# Patient Record
Sex: Male | Born: 1985 | Race: White | Hispanic: No | Marital: Married | Smoking: Current every day smoker
Health system: Southern US, Community
[De-identification: ages and names within clinical notes are randomized; demographics above are authoritative.]

## PROBLEM LIST (undated history)

## (undated) DIAGNOSIS — K625 Hemorrhage of anus and rectum: Secondary | ICD-10-CM

## (undated) DIAGNOSIS — F329 Major depressive disorder, single episode, unspecified: Secondary | ICD-10-CM

## (undated) DIAGNOSIS — F1020 Alcohol dependence, uncomplicated: Secondary | ICD-10-CM

## (undated) DIAGNOSIS — K645 Perianal venous thrombosis: Secondary | ICD-10-CM

## (undated) DIAGNOSIS — K602 Anal fissure, unspecified: Secondary | ICD-10-CM

## (undated) DIAGNOSIS — F909 Attention-deficit hyperactivity disorder, unspecified type: Secondary | ICD-10-CM

## (undated) DIAGNOSIS — F419 Anxiety disorder, unspecified: Secondary | ICD-10-CM

## (undated) DIAGNOSIS — F32A Depression, unspecified: Secondary | ICD-10-CM

## (undated) DIAGNOSIS — F1910 Other psychoactive substance abuse, uncomplicated: Secondary | ICD-10-CM

## (undated) DIAGNOSIS — K219 Gastro-esophageal reflux disease without esophagitis: Secondary | ICD-10-CM

## (undated) HISTORY — DX: Anxiety disorder, unspecified: F41.9

## (undated) HISTORY — DX: Hemorrhage of anus and rectum: K62.5

## (undated) HISTORY — DX: Alcohol dependence, uncomplicated: F10.20

## (undated) HISTORY — PX: HEMORRHOID SURGERY: SHX153

## (undated) HISTORY — DX: Perianal venous thrombosis: K64.5

## (undated) HISTORY — DX: Depression, unspecified: F32.A

## (undated) HISTORY — PX: WISDOM TOOTH EXTRACTION: SHX21

## (undated) HISTORY — DX: Gastro-esophageal reflux disease without esophagitis: K21.9

## (undated) HISTORY — DX: Other psychoactive substance abuse, uncomplicated: F19.10

## (undated) HISTORY — DX: Attention-deficit hyperactivity disorder, unspecified type: F90.9

## (undated) HISTORY — DX: Major depressive disorder, single episode, unspecified: F32.9

## (undated) HISTORY — PX: OTHER SURGICAL HISTORY: SHX169

---

## 2006-05-20 ENCOUNTER — Ambulatory Visit (HOSPITAL_COMMUNITY): Admission: RE | Admit: 2006-05-20 | Discharge: 2006-05-20 | Payer: Self-pay | Admitting: Orthopedic Surgery

## 2007-12-17 ENCOUNTER — Emergency Department (HOSPITAL_COMMUNITY): Admission: EM | Admit: 2007-12-17 | Discharge: 2007-12-17 | Payer: Self-pay | Admitting: Emergency Medicine

## 2008-10-09 ENCOUNTER — Ambulatory Visit (HOSPITAL_COMMUNITY): Admission: RE | Admit: 2008-10-09 | Discharge: 2008-10-09 | Payer: Self-pay | Admitting: Family Medicine

## 2009-01-28 ENCOUNTER — Emergency Department (HOSPITAL_COMMUNITY): Admission: EM | Admit: 2009-01-28 | Discharge: 2009-01-28 | Payer: Self-pay | Admitting: Emergency Medicine

## 2009-05-04 ENCOUNTER — Emergency Department (HOSPITAL_COMMUNITY): Admission: EM | Admit: 2009-05-04 | Discharge: 2009-05-04 | Payer: Self-pay | Admitting: Emergency Medicine

## 2009-10-08 ENCOUNTER — Emergency Department (HOSPITAL_COMMUNITY): Admission: EM | Admit: 2009-10-08 | Discharge: 2009-10-08 | Payer: Self-pay | Admitting: Emergency Medicine

## 2010-03-08 ENCOUNTER — Emergency Department (HOSPITAL_COMMUNITY)
Admission: EM | Admit: 2010-03-08 | Discharge: 2010-03-09 | Payer: Self-pay | Source: Home / Self Care | Admitting: Emergency Medicine

## 2010-03-11 LAB — BASIC METABOLIC PANEL
BUN: 2 mg/dL — ABNORMAL LOW (ref 6–23)
CO2: 26 mEq/L (ref 19–32)
Calcium: 9.3 mg/dL (ref 8.4–10.5)
Chloride: 108 mEq/L (ref 96–112)
Creatinine, Ser: 0.8 mg/dL (ref 0.4–1.5)
GFR calc Af Amer: >60 mL/min (ref >60)
GFR calc non Af Amer: >60 mL/min (ref >60)
Glucose, Bld: 100 mg/dL — ABNORMAL HIGH (ref 70–99)
Potassium: 3.5 mEq/L (ref 3.5–5.1)
Sodium: 144 mEq/L (ref 135–145)

## 2010-03-11 LAB — DIFFERENTIAL
Basophils Absolute: 0 10*3/uL (ref 0.0–0.1)
Basophils Relative: 0 % (ref 0–1)
Eosinophils Absolute: 0.1 10*3/uL (ref 0.0–0.7)
Eosinophils Relative: 1 % (ref 0–5)
Lymphocytes Relative: 42 % (ref 12–46)
Lymphs Abs: 3 10*3/uL (ref 0.7–4.0)
Monocytes Absolute: 0.5 10*3/uL (ref 0.1–1.0)
Monocytes Relative: 7 % (ref 3–12)
Neutro Abs: 3.5 10*3/uL (ref 1.7–7.7)
Neutrophils Relative %: 50 % (ref 43–77)

## 2010-03-11 LAB — CBC
HCT: 44.2 % (ref 39.0–52.0)
Hemoglobin: 15.8 g/dL (ref 13.0–17.0)
MCH: 32.9 pg (ref 26.0–34.0)
MCHC: 35.7 g/dL (ref 30.0–36.0)
MCV: 92.1 fL (ref 78.0–100.0)
Platelets: 161 10*3/uL (ref 150–400)
RBC: 4.8 MIL/uL (ref 4.22–5.81)
RDW: 13.1 % (ref 11.5–15.5)
WBC: 7.1 10*3/uL (ref 4.0–10.5)

## 2010-03-11 LAB — ETHANOL: Alcohol, Ethyl (B): 191 mg/dL — ABNORMAL HIGH (ref 0–10)

## 2010-05-20 LAB — URINALYSIS, ROUTINE W REFLEX MICROSCOPIC
Bilirubin Urine: NEGATIVE
Glucose, UA: NEGATIVE mg/dL
Ketones, ur: NEGATIVE mg/dL
Leukocytes, UA: NEGATIVE
Nitrite: NEGATIVE
Protein, ur: NEGATIVE mg/dL
Specific Gravity, Urine: 1.01 (ref 1.005–1.030)
Urobilinogen, UA: 0.2 mg/dL (ref 0.0–1.0)
pH: 5.5 (ref 5.0–8.0)

## 2010-05-20 LAB — CBC
HCT: 56.3 % — ABNORMAL HIGH (ref 39.0–52.0)
Hemoglobin: 19.8 g/dL — ABNORMAL HIGH (ref 13.0–17.0)
MCHC: 35.1 g/dL (ref 30.0–36.0)
MCV: 91.8 fL (ref 78.0–100.0)
Platelets: 142 10*3/uL — ABNORMAL LOW (ref 150–400)
RBC: 6.14 MIL/uL — ABNORMAL HIGH (ref 4.22–5.81)
RDW: 13.6 % (ref 11.5–15.5)
WBC: 7.3 10*3/uL (ref 4.0–10.5)

## 2010-05-20 LAB — BASIC METABOLIC PANEL
BUN: 5 mg/dL — ABNORMAL LOW (ref 6–23)
CO2: 26 mEq/L (ref 19–32)
Calcium: 9.6 mg/dL (ref 8.4–10.5)
Chloride: 109 mEq/L (ref 96–112)
Creatinine, Ser: 0.87 mg/dL (ref 0.4–1.5)
GFR calc Af Amer: >60 mL/min (ref >60)
GFR calc non Af Amer: >60 mL/min (ref >60)
Glucose, Bld: 123 mg/dL — ABNORMAL HIGH (ref 70–99)
Potassium: 3.4 mEq/L — ABNORMAL LOW (ref 3.5–5.1)
Sodium: 143 mEq/L (ref 135–145)

## 2010-05-20 LAB — DIFFERENTIAL
Basophils Absolute: 0 K/uL (ref 0.0–0.1)
Basophils Relative: 0 % (ref 0–1)
Eosinophils Absolute: 0 K/uL (ref 0.0–0.7)
Eosinophils Relative: 0 % (ref 0–5)
Lymphocytes Relative: 21 % (ref 12–46)
Lymphs Abs: 1.5 K/uL (ref 0.7–4.0)
Monocytes Absolute: 0.6 K/uL (ref 0.1–1.0)
Monocytes Relative: 8 % (ref 3–12)
Neutro Abs: 5.2 K/uL (ref 1.7–7.7)
Neutrophils Relative %: 71 % (ref 43–77)

## 2010-05-20 LAB — URINE MICROSCOPIC-ADD ON

## 2010-05-28 LAB — URINALYSIS, ROUTINE W REFLEX MICROSCOPIC
Bilirubin Urine: NEGATIVE
Glucose, UA: NEGATIVE mg/dL
Ketones, ur: NEGATIVE mg/dL
Leukocytes, UA: NEGATIVE
Nitrite: NEGATIVE
Protein, ur: NEGATIVE mg/dL
Specific Gravity, Urine: 1.011 (ref 1.005–1.030)
Urobilinogen, UA: 0.2 mg/dL (ref 0.0–1.0)
pH: 6 (ref 5.0–8.0)

## 2010-05-28 LAB — COMPREHENSIVE METABOLIC PANEL
ALT: 24 U/L (ref 0–53)
AST: 25 U/L (ref 0–37)
Albumin: 4.2 g/dL (ref 3.5–5.2)
Alkaline Phosphatase: 86 U/L (ref 39–117)
BUN: 10 mg/dL (ref 6–23)
CO2: 23 mEq/L (ref 19–32)
Calcium: 8.9 mg/dL (ref 8.4–10.5)
Chloride: 107 mEq/L (ref 96–112)
Creatinine, Ser: 0.81 mg/dL (ref 0.4–1.5)
GFR calc Af Amer: >60 mL/min (ref >60)
GFR calc non Af Amer: >60 mL/min (ref >60)
Glucose, Bld: 80 mg/dL (ref 70–99)
Potassium: 3.7 mEq/L (ref 3.5–5.1)
Sodium: 142 mEq/L (ref 135–145)
Total Bilirubin: 0.7 mg/dL (ref 0.3–1.2)
Total Protein: 6.7 g/dL (ref 6.0–8.3)

## 2010-05-28 LAB — DIFFERENTIAL
Basophils Absolute: 0.1 10*3/uL (ref 0.0–0.1)
Basophils Relative: 1 % (ref 0–1)
Eosinophils Absolute: 0.6 10*3/uL (ref 0.0–0.7)
Eosinophils Relative: 8 % — ABNORMAL HIGH (ref 0–5)
Lymphocytes Relative: 38 % (ref 12–46)
Lymphs Abs: 3.1 10*3/uL (ref 0.7–4.0)
Monocytes Absolute: 0.5 10*3/uL (ref 0.1–1.0)
Monocytes Relative: 7 % (ref 3–12)
Neutro Abs: 3.8 10*3/uL (ref 1.7–7.7)
Neutrophils Relative %: 47 % (ref 43–77)

## 2010-05-28 LAB — URINE MICROSCOPIC-ADD ON

## 2010-05-28 LAB — CBC
HCT: 51.4 % (ref 39.0–52.0)
Hemoglobin: 18.1 g/dL — ABNORMAL HIGH (ref 13.0–17.0)
MCHC: 35.2 g/dL (ref 30.0–36.0)
MCV: 96.2 fL (ref 78.0–100.0)
Platelets: 196 10*3/uL (ref 150–400)
RBC: 5.34 MIL/uL (ref 4.22–5.81)
RDW: 13.2 % (ref 11.5–15.5)
WBC: 8.2 10*3/uL (ref 4.0–10.5)

## 2010-05-28 LAB — RAPID URINE DRUG SCREEN, HOSP PERFORMED
Amphetamines: NOT DETECTED
Barbiturates: NOT DETECTED
Benzodiazepines: POSITIVE — AB
Cocaine: NOT DETECTED
Opiates: NOT DETECTED
Tetrahydrocannabinol: NOT DETECTED

## 2010-05-28 LAB — ETHANOL: Alcohol, Ethyl (B): 149 mg/dL — ABNORMAL HIGH (ref 0–10)

## 2010-06-17 ENCOUNTER — Other Ambulatory Visit (HOSPITAL_COMMUNITY): Payer: Self-pay | Admitting: Pulmonary Disease

## 2010-06-19 ENCOUNTER — Ambulatory Visit (HOSPITAL_COMMUNITY)
Admission: RE | Admit: 2010-06-19 | Discharge: 2010-06-19 | Disposition: A | Discharge status: Home / Self Care | Payer: 59 | Source: Ambulatory Visit | Attending: Pulmonary Disease | Admitting: Pulmonary Disease

## 2010-06-20 ENCOUNTER — Ambulatory Visit (HOSPITAL_COMMUNITY): Payer: 59

## 2010-06-20 ENCOUNTER — Ambulatory Visit (INDEPENDENT_AMBULATORY_CARE_PROVIDER_SITE_OTHER): Payer: 59 | Admitting: Gastroenterology

## 2010-06-20 ENCOUNTER — Encounter: Payer: Self-pay | Admitting: Gastroenterology

## 2010-06-20 DIAGNOSIS — K645 Perianal venous thrombosis: Secondary | ICD-10-CM

## 2010-06-20 DIAGNOSIS — K921 Melena: Secondary | ICD-10-CM

## 2010-06-20 DIAGNOSIS — R109 Unspecified abdominal pain: Secondary | ICD-10-CM

## 2010-06-20 MED ORDER — HYDROCORTISONE 2.5 % RE CREA: TOPICAL_CREAM | RECTAL | Status: DC

## 2010-06-20 MED ORDER — HYDROCORTISONE 2.5 % RE CREA: TOPICAL_CREAM | RECTAL | Status: AC

## 2010-06-20 NOTE — Progress Notes (Signed)
Referring Provider: Dr. Juanetta Gosling Primary Care Physician:  Fredirick Maudlin, MD Primary Gastroenterologist:  Dr. Jena Gauss  Chief Complaint  Patient presents with  . Rectal Bleeding    on toilet paper and in stool per pt    HPI:   Anthony Morales is a 25 year old Caucasian male who presents as a referral from Dr. Juanetta Gosling. He reports lower abdominal pain X 1 month, intermittent, described as "clenching". He states at times this extends down to his knees. Feels it is always underlying, waxes and wanes. Not worsened by eating/drinking. Nothing relieves. Not associated with BM. Reports BM every day, +blood in stools X 1 year: paper hematochezia and moderate amount in stool. Was previously taking a few BC powders per day due to headaches in the past but has stopped X 1 month. No NSAIDs. Reports weighing 120s a few months ago. Now 105. No lack of appetite. Complains of headaches.  He also reports rectal discomfort, itching. Feeling of fullness in the perineal area. Hx of hemorrhoids.  CT April 2012 at [facility]: retrocecal appendix, normal. No bowel dilatation, free fluid, free air, or adenopathy.  LFTs nl Hgb 15 WBC 5.8  Past Medical History  Diagnosis Date  . Anxiety   . ADD (attention deficit disorder with hyperactivity)     Past Surgical History  Procedure Date  . Wisdom tooth extraction     Current Outpatient Prescriptions  Medication Sig Dispense Refill  . ALPRAZolam (XANAX) 0.5 MG tablet Take 1 tablet by mouth Twice daily.      Marland Kitchen amphetamine-dextroamphetamine (ADDERALL) 30 MG tablet Take 1 tablet by mouth Twice daily.      Marland Kitchen esomeprazole (NEXIUM) 40 MG capsule Take 40 mg by mouth daily before breakfast.        . loratadine (CLARITIN) 10 MG tablet Take 10 mg by mouth daily.        Marland Kitchen UNKNOWN TO PATIENT Take 1 tablet by mouth 2 (two) times daily.          Allergies as of 06/20/2010  . (No Known Allergies)    Family History  Problem Relation Age of Onset  . Hypertension Father    living  . Colon cancer Neg Hx     History   Social History  . Marital Status: Single   Social History Main Topics  . Smoking status: Current Everyday Smoker -- 0.5 packs/day for 10 years    Types: Cigarettes  . Smokeless tobacco: Never Used  . Alcohol Use: No     quit drinking beginning of this year Jan 15th. used to drink every day.   . Drug Use: 14 per week    Special: Marijuana     several times per day  . Sexually Active: Not Currently -- Male partner(s)    Birth Control/ Protection: Pill     spouse    Review of Systems: Gen: Denies any fever, chills, sweats. Complains of wt loss.  CV: Denies chest pain, angina, palpitations, syncope, orthopnea, PND, peripheral edema, and claudication. Resp: Denies dyspnea at rest, dyspnea with exercise, cough, sputum, wheezing, coughing up blood, and pleurisy. GI: Denies vomiting blood, jaundice, and fecal incontinence.   Denies dysphagia or odynophagia. GU : Denies urinary burning, blood in urine, urinary frequency, urinary hesitancy, nocturnal urination, and urinary incontinence. MS: Denies joint pain, limitation of movement, and swelling, stiffness, low back pain, extremity pain. Denies muscle weakness, cramps, atrophy.  Derm: Denies rash, itching, dry skin, hives, moles, warts, or unhealing ulcers.  Psych: Denies depression, anxiety,  memory loss, suicidal ideation, hallucinations, paranoia, and confusion. Heme: Denies bruising, bleeding, and enlarged lymph nodes.  Physical Exam: BP 118/81  Pulse 90  Temp 98.6 F (37 C)  Ht 5\' 10"  (1.778 m)  Wt 105 lb 9.6 oz (47.9 kg)  BMI 15.15 kg/m2 General:   Flat affect, somewhat difficult to concentrate. Thin.  Head:  Normocephalic and atraumatic. Eyes:  Sclera clear, no icterus.   Conjunctiva pink. Ears:  Normal auditory acuity. Nose:  No deformity, discharge,  or lesions. Mouth:  No deformity or lesions, dentition normal. Neck:  Supple; no masses or thyromegaly. Lungs:  Clear  throughout to auscultation.   No wheezes, crackles, or rhonchi. No acute distress. Heart:  Regular rate and rhythm; no murmurs, clicks, rubs,  or gallops. Abdomen:  +BS, soft, thin, tender to palpation lower abdomen, no rebound or guarding. No HSM or masses noted.  Rectal:  Large external hemorrhoid, appears thrombosed, tender to palpation, no evidence of bleeding.  Msk:  Symmetrical without gross deformities. Normal posture. Extremities:  Without clubbing or edema. Neurologic:  Alert and  oriented x4;  grossly normal neurologically. Skin:  Intact without significant lesions or rashes. Cervical Nodes:  No significant cervical adenopathy. Psych:  Alert and cooperative. Normal mood and affect.

## 2010-06-20 NOTE — Patient Instructions (Signed)
We will refer you to see the general surgeon for possible management of your hemorrhoids.  In the interim, you will be set up for a colonoscopy. You will have a drug screen done at the time of your pre-op visit. They may decide to cancel or postpone your case if it is positive.  Avoid constipation. Continue stool softener daily.

## 2010-06-24 ENCOUNTER — Ambulatory Visit (HOSPITAL_COMMUNITY): Payer: 59

## 2010-06-24 ENCOUNTER — Encounter: Payer: Self-pay | Admitting: Gastroenterology

## 2010-06-24 DIAGNOSIS — K645 Perianal venous thrombosis: Secondary | ICD-10-CM | POA: Insufficient documentation

## 2010-06-24 DIAGNOSIS — R109 Unspecified abdominal pain: Secondary | ICD-10-CM | POA: Insufficient documentation

## 2010-06-24 DIAGNOSIS — K921 Melena: Secondary | ICD-10-CM | POA: Insufficient documentation

## 2010-06-24 NOTE — Assessment & Plan Note (Signed)
Paper hematochezia and in stool X 1 year. Intermittent. +lower abdominal pain. May be due to known hemorrhoids, but unable to exclude other etiology at this point. Will proceed with colonoscopy in next few weeks. Pt smokes marijuana daily, and he has been heavily counseled on cessation. He was informed a drug screen will be performed, and he may be cancelled if it is positive. He states "I will go to another state if I have to". Pt is fully aware of our drug-free policy.   Proceed with TCS with Dr. Jena Gauss in near future: the risks, benefits, and alternatives have been discussed with the patient in detail. The patient states understanding and desires to proceed. This will be done under deep sedation with propofol secondary to the daily use of marijuana.  Drug screen at pre-op

## 2010-06-24 NOTE — Progress Notes (Signed)
Cc to PCP 

## 2010-06-24 NOTE — Assessment & Plan Note (Signed)
Large external thrombosed hemorrhoid without evidence of active bleeding. May very well be contributing to hematochezia, but needs further evaluation due to abdominal pain and rectal bleeding (see abdominal pain assessment). In interim, will refer to surgery for elective management and relief of symptoms; anusol cream in interim.   Referral to surgery tomorrow Anusol cream Colonoscopy in next few weeks

## 2010-06-24 NOTE — Assessment & Plan Note (Signed)
lower abdominal pain X 1 month, intermittent, described as "clenching", sometimes extending down to knees. Feels underlying but waxes and wanes without pattern. Not associated or relieved by anything. BM daily. +hematochezia. No BC powders since pain started. Avoiding NSAIDs now. + wt loss from 120s reportedly to now 105. Pt is visibly thin. Denies lack of appetite. CT without etiology, no leukocytosis or anemia on labs. Hematochezia may be due to benign anorectal source (known hemorrhoids), unable to exclude other differentials to include IBS, IBD. Low-likelihood of malignancy but wt loss concerning.  See plan under "hematochezia".

## 2010-06-26 ENCOUNTER — Other Ambulatory Visit: Payer: Self-pay | Admitting: Internal Medicine

## 2010-06-26 DIAGNOSIS — K921 Melena: Secondary | ICD-10-CM

## 2010-07-01 ENCOUNTER — Encounter: Payer: Self-pay | Admitting: Internal Medicine

## 2010-07-03 ENCOUNTER — Encounter: Payer: Self-pay | Admitting: Gastroenterology

## 2010-07-11 ENCOUNTER — Other Ambulatory Visit (HOSPITAL_COMMUNITY): Payer: 59

## 2010-07-11 ENCOUNTER — Encounter: Payer: Self-pay | Admitting: General Practice

## 2010-07-11 NOTE — Progress Notes (Signed)
I received a call from [employer] in Endoscopy.  She stated the patient didn't show up for his pre-perative visit.  They were able to reach the patient by phone and he stated "he didn't want to have the procedure" on  next week because"he felt better."  I called the patient to verify this and left him a message on his cell phone.Marland KitchenMarland Kitchen

## 2010-07-12 NOTE — Progress Notes (Signed)
I tried to reach the pt again, no answer.  I called and left a message for Selena Batten in endoscopy to cancel his procedure.Marland KitchenMarland Kitchen

## 2010-07-18 ENCOUNTER — Ambulatory Visit (HOSPITAL_COMMUNITY): Admission: RE | Admit: 2010-07-18 | Payer: 59 | Source: Ambulatory Visit | Admitting: Internal Medicine

## 2010-07-18 ENCOUNTER — Encounter: Payer: 59 | Admitting: Internal Medicine

## 2010-07-19 ENCOUNTER — Other Ambulatory Visit (HOSPITAL_COMMUNITY): Payer: 59

## 2010-07-25 ENCOUNTER — Encounter: Payer: 59 | Admitting: Internal Medicine

## 2010-10-10 ENCOUNTER — Telehealth: Payer: Self-pay | Admitting: Internal Medicine

## 2010-10-10 NOTE — Telephone Encounter (Signed)
Patient came by to sign a release to have medical records transferred to [facility] and I explained to him that he cant be running back an fourth between the two offices and he said he was transferring his care to [facility] so I faxed his records over.

## 2010-10-11 ENCOUNTER — Encounter: Payer: Self-pay | Admitting: Internal Medicine

## 2010-10-11 NOTE — Progress Notes (Signed)
  Made change in epic

## 2010-10-11 NOTE — Progress Notes (Signed)
Do you want to send d/c letter?

## 2010-10-11 NOTE — Progress Notes (Signed)
  Patient has chosen to severe our doctor-patient relationship and is seeking  his medical care elsewhere. It is also notable he has been noncompliant here.

## 2010-10-11 NOTE — Progress Notes (Signed)
Noted  

## 2010-10-11 NOTE — Progress Notes (Signed)
  Per Dr Jena Gauss pt will not be seen here after 30 days from today.

## 2010-10-14 ENCOUNTER — Encounter: Payer: Self-pay | Admitting: Internal Medicine

## 2010-11-11 ENCOUNTER — Encounter: Payer: Self-pay | Admitting: Internal Medicine

## 2010-11-11 ENCOUNTER — Other Ambulatory Visit (INDEPENDENT_AMBULATORY_CARE_PROVIDER_SITE_OTHER): Payer: 59

## 2010-11-11 ENCOUNTER — Ambulatory Visit (INDEPENDENT_AMBULATORY_CARE_PROVIDER_SITE_OTHER): Payer: 59 | Admitting: Internal Medicine

## 2010-11-11 VITALS — BP 134/80 | HR 79 | Ht 69.0 in | Wt 112.0 lb

## 2010-11-11 DIAGNOSIS — R1030 Lower abdominal pain, unspecified: Secondary | ICD-10-CM

## 2010-11-11 DIAGNOSIS — K625 Hemorrhage of anus and rectum: Secondary | ICD-10-CM

## 2010-11-11 DIAGNOSIS — F1011 Alcohol abuse, in remission: Secondary | ICD-10-CM | POA: Insufficient documentation

## 2010-11-11 DIAGNOSIS — F909 Attention-deficit hyperactivity disorder, unspecified type: Secondary | ICD-10-CM | POA: Insufficient documentation

## 2010-11-11 DIAGNOSIS — R109 Unspecified abdominal pain: Secondary | ICD-10-CM

## 2010-11-11 DIAGNOSIS — F419 Anxiety disorder, unspecified: Secondary | ICD-10-CM | POA: Insufficient documentation

## 2010-11-11 DIAGNOSIS — F1210 Cannabis abuse, uncomplicated: Secondary | ICD-10-CM | POA: Insufficient documentation

## 2010-11-11 LAB — CBC WITH DIFFERENTIAL/PLATELET
Basophils Absolute: 0 10*3/uL (ref 0.0–0.1)
Eosinophils Absolute: 0.2 10*3/uL (ref 0.0–0.7)
HCT: 45.8 % (ref 39.0–52.0)
Hemoglobin: 15.5 g/dL (ref 13.0–17.0)
Lymphs Abs: 2.9 10*3/uL (ref 0.7–4.0)
MCHC: 33.9 g/dL (ref 30.0–36.0)
Neutro Abs: 4.3 10*3/uL (ref 1.4–7.7)
RDW: 14.1 % (ref 11.5–14.6)

## 2010-11-11 LAB — COMPREHENSIVE METABOLIC PANEL
ALT: 31 U/L (ref 0–53)
AST: 24 U/L (ref 0–37)
Alkaline Phosphatase: 49 U/L (ref 39–117)
Creatinine, Ser: 0.8 mg/dL (ref 0.4–1.5)
Total Bilirubin: 0.5 mg/dL (ref 0.3–1.2)

## 2010-11-11 LAB — PROTIME-INR: INR: 0.9 ratio (ref 0.8–1.0)

## 2010-11-11 MED ORDER — PEG-KCL-NACL-NASULF-NA ASC-C 100 G PO SOLR: 1.0000 | ORAL | Status: DC

## 2010-11-11 NOTE — Patient Instructions (Signed)
You have been scheduled for colonoscopy. Your prep has been sent to your pharmacy. Decrease your nexium to 20 mg per day. Please head down to the basement for labs today.

## 2010-11-11 NOTE — Progress Notes (Signed)
Subjective:    Patient ID: Anthony Morales, male    DOB: December 11, 1985, 25 y.o.   MRN: [id number]  HPI Anthony Morales is a 25 year old male with a past medical history of anxiety and ADD who presents for evaluation of intermittent rectal bleeding with diarrhea and abdominal pain.  The patient reports he developed bright red blood per rectum in March and April 2012. This was associated with an external hemorrhoid and he underwent a thrombectomy in April 2012. Since this time his hemorrhoidal pain has been improved but he is still seen blood in his stool. He reports continuing to see blood intermittently despite hemorrhoids not being a big problem for him at present. He denies melena. The last time he saw a red blood in his stool was 3 days ago. He reports diarrhea intermittently and otherwise loose stools. He is having 2-3 per day. He is also reporting mucus in his stools. He endorses tenesmus but denies fecal seepage or episodes of incontinence. He also reports anterior abdominal pain located around his umbilicus. He describes this as a "clenching" pain. This tends to be worse with some foods, but overall is unpredictable. He does not relate this to bowel movement. He reports his appetite is okay, he has lost some weight but he relates this to depressed mood. He reports feeling depressed over recent job loss, though admits this is somewhat better over the last few months. He denies SI/HI. He denies nausea and vomiting. He was having intermittent heartburn was placed on Nexium. He first was on 20 mg daily but this was raised to 40 mg a day. He is unsure why the dose was escalated. He reports some heartburn symptoms if he misses doses. He denies dysphagia and odynophagia.  No fevers or chills.  He denies eye complaints or change in vision. He's had no rashes. No oral ulcers.  Review of Systems Constitutional: Negative for fever, chills, night sweats, activity change, appetite change and unexpected weight  change HEENT: Negative for sore throat, mouth sores and trouble swallowing. Eyes: Negative for visual disturbance Respiratory: Negative for cough, chest tightness and shortness of breath Cardiovascular: Negative for chest pain, palpitations and lower extremity swelling Gastrointestinal: See history of present illness Genitourinary: Negative for dysuria and hematuria. Musculoskeletal: Positive for lower back pain, and lower extremity muscle soreness Skin: Negative for rash or color change Neurological: Negative for  weakness, numbness; positive for occasional headaches Hematological: Negative for adenopathy, negative for easy bruising/bleeding Psychiatric/behavioral: Positive for anxiety, depressed mood is improved (see HPI)  Past Medical History  Diagnosis Date  . Anxiety   . ADD (attention deficit disorder with hyperactivity)   . External hemorrhoid, thrombosed   . Alcoholism     quit a year ago  . Depression   . Acid reflux   . Rectal bleeding    Current Outpatient Prescriptions  Medication Sig Dispense Refill  . ALPRAZolam (XANAX) 0.5 MG tablet Take 1 mg by mouth Twice daily.       Marland Kitchen amphetamine-dextroamphetamine (ADDERALL) 30 MG tablet Take 1 tablet by mouth Twice daily.      Marland Kitchen esomeprazole (NEXIUM) 40 MG capsule Take 40 mg by mouth as needed.       . loratadine (CLARITIN) 10 MG tablet Take 10 mg by mouth daily.        . Pseudoephedrine HCl (SUDAFED PO) Take by mouth. 1 every four hours       . hydrocortisone (ANUSOL-HC) 2.5 % rectal cream Apply rectally 2 times daily for  7 days.  30 g  0  . peg 3350 powder (MOVIPREP) 100 G SOLR Take 1 kit (100 g total) by mouth as directed. See written handout  1 kit  0  . UNKNOWN TO PATIENT Take 1 tablet by mouth 2 (two) times daily.         No Known Allergies  Family History  Problem Relation Age of Onset  . Hypertension Father     living  . Colon cancer Neg Hx   . DVT/PE Maternal Grandmother   --no known IBD hx  Social History  .  Marital Status: Single    Number of Children: 1 son    Social History Main Topics  . Smoking status: Current Everyday Smoker -- 0.5 packs/day for 10 years    Types: Cigarettes  . Smokeless tobacco: Never Used  . Alcohol Use: No     quit drinking beginning of this year Jan 15th. used to drink every day.   . Drug Use: 14 per week    Special: Marijuana     several times per day  . Sexually Active: Not Currently -- Male partner(s)    Birth Control/ Protection: Pill     spouse   Social History Narrative   6 caffeine drinks daily      Objective:   Physical Exam BP 134/80  Pulse 79  Ht 5\' 9"  (1.753 m)  Wt 112 lb (50.803 kg)  BMI 16.54 kg/m2 Constitutional: Well-developed and well-nourished. No distress. HEENT: Normocephalic and atraumatic. Oropharynx is clear and moist. No oropharyngeal exudate. Conjunctivae are normal. Pupils are equal round and reactive to light. No scleral icterus. Neck: Neck supple. Trachea midline. Cardiovascular: Normal rate, regular rhythm and intact distal pulses. No M/R/G Pulmonary/chest: Effort normal and breath sounds normal. No wheezing, rales or rhonchi. Abdominal: Soft, scaphoid, nontender, nondistended. Bowel sounds active throughout. There are no masses palpable. No hepatosplenomegaly. Lymphadenopathy: No cervical adenopathy noted. Neurological: Alert and oriented to person place and time. Skin: Skin is warm and dry. No rashes noted. Psychiatric: Normal mood and affect. Behavior is normal.     Assessment & Plan:  25 year old male with a past medical history of ADD and anxiety presenting with ongoing intermittent bloody diarrhea and abdominal pain.  1. Abd pain/bloody stools -- the patient did previously have hemorrhoidal bleeding but this seems to no longer be an issue. He underwent external hemorrhoid thrombectomy with good relief of symptoms. His symptoms now including abdominal pain, tenesmus, and loose stools with frequent blood is concerning  for possible underlying inflammatory bowel disease. I will check labs today to include CBC, Chem-7, and coags. We will proceed with colonoscopy for further evaluation and characterization of his symptoms. We will do this with propofol sedation. We discussed this procedure at length today.  2.  GERD -- symptoms are well-controlled on PPI. They seem to be well controlled on Nexium 20 mg, and he is unsure why this dose was increased. Therefore I will decrease his Nexium to 20 mg daily. There are no other upper alarm symptoms at present.  Followup will be determined after colonoscopy.

## 2010-11-12 ENCOUNTER — Telehealth: Payer: Self-pay | Admitting: *Deleted

## 2010-11-12 NOTE — Telephone Encounter (Signed)
Message copied by Florene Glen on Tue Nov 12, 2010  2:15 PM ------      Message from: Beverley Fiedler      Created: Tue Nov 12, 2010 12:54 PM       Please let pt know labs are normal.      Thanks      HP negative, therefore no treatment needed.

## 2010-11-12 NOTE — Telephone Encounter (Signed)
Notified pt per Dr Rhea Belton, labs are normal and the H.Pylori was negative. Pt stated understanding and  will call back on an as needed basis.

## 2010-11-26 ENCOUNTER — Encounter: Payer: Self-pay | Admitting: Internal Medicine

## 2010-11-26 ENCOUNTER — Telehealth: Payer: Self-pay | Admitting: *Deleted

## 2010-11-26 ENCOUNTER — Ambulatory Visit: Payer: 59 | Admitting: Internal Medicine

## 2010-11-26 VITALS — Temp 97.8°F | Ht 69.0 in | Wt 112.0 lb

## 2010-11-26 DIAGNOSIS — K625 Hemorrhage of anus and rectum: Secondary | ICD-10-CM

## 2010-11-26 LAB — DIFFERENTIAL
Eosinophils Absolute: 0.1
Eosinophils Relative: 3
Lymphocytes Relative: 44
Lymphs Abs: 1.7
Monocytes Relative: 13 — ABNORMAL HIGH

## 2010-11-26 LAB — CBC
HCT: 56.7 — ABNORMAL HIGH
Hemoglobin: 19.1 — ABNORMAL HIGH
MCV: 96
RBC: 5.91 — ABNORMAL HIGH
WBC: 3.8 — ABNORMAL LOW

## 2010-11-26 LAB — POCT I-STAT, CHEM 8
BUN: 5 — ABNORMAL LOW
Creatinine, Ser: 1
Glucose, Bld: 85
Hemoglobin: 20.1 — ABNORMAL HIGH
Potassium: 3.6
Sodium: 143

## 2010-11-26 LAB — URINALYSIS, ROUTINE W REFLEX MICROSCOPIC
Glucose, UA: NEGATIVE
Ketones, ur: NEGATIVE
Leukocytes, UA: NEGATIVE
Protein, ur: NEGATIVE
pH: 6.5

## 2010-11-26 LAB — RAPID URINE DRUG SCREEN, HOSP PERFORMED
Barbiturates: NOT DETECTED
Benzodiazepines: POSITIVE — AB
Cocaine: NOT DETECTED

## 2010-11-26 LAB — ETHANOL: Alcohol, Ethyl (B): 17 — ABNORMAL HIGH

## 2010-11-26 LAB — URINE MICROSCOPIC-ADD ON

## 2010-11-26 NOTE — Telephone Encounter (Signed)
Anthony Morales, He was scheduled for a colonoscopy today. This pt did not drink his second dose of Moviprep today and also ate this morning.  Dr. Rhea Belton states pt needs to be rescheduled.  I tried to set him up for another colonoscopy, but Dr. Rhea Belton doesn't have any openings on his next Propofol day on 12-12-10.  The schedule for November for propofol wasn't available for me to reschedule.  Could you please reschedule this?  And a previsit?  Thank you.  Baxter Hire

## 2010-11-26 NOTE — Progress Notes (Signed)
Pt ate Raisin Bran yesterday morning, ate cornbread today.  Pt states he drank dose of Moviprep last night but didn't drink this morning's dose.  States he spilled this morning's dose and his wife called in and spoke with the on call doctor.  He states, "the on call doctor said that was fine."  Dr. Rhea Belton made aware and states that pt will have to reschedule.    Dr. Rhea Belton doesn't have any openings in his schedule for propofol in October.  Note sent to Graciella Freer RN to set up next available colonoscopy and previsit appointment.  Pt instructed and understanding voiced.  Tried to schedule another colonoscopy, but schedule unavailable.  Note sent to Graciella Freer, RN to reschedule this when November's schedule is available.

## 2010-12-04 MED ORDER — PEG-KCL-NACL-NASULF-NA ASC-C 100 G PO SOLR: 1.0000 | Freq: Once | ORAL | Status: DC

## 2010-12-04 NOTE — Telephone Encounter (Signed)
Spoke with wife and scheduled pt for a COLON with Propofol for 01/07/11 at 1030am. Offered a Pre Visit and wife stated they know what to do now. Will send instructions and reorder the MOVI PREP. Pt will call for questions.

## 2010-12-11 ENCOUNTER — Encounter (HOSPITAL_COMMUNITY): Payer: 59 | Admitting: Psychiatry

## 2010-12-25 ENCOUNTER — Ambulatory Visit (INDEPENDENT_AMBULATORY_CARE_PROVIDER_SITE_OTHER): Payer: 59 | Admitting: Psychiatry

## 2010-12-25 DIAGNOSIS — F3289 Other specified depressive episodes: Secondary | ICD-10-CM

## 2010-12-25 DIAGNOSIS — F329 Major depressive disorder, single episode, unspecified: Secondary | ICD-10-CM

## 2011-01-02 ENCOUNTER — Encounter (HOSPITAL_COMMUNITY): Payer: Self-pay | Admitting: Psychiatry

## 2011-01-02 ENCOUNTER — Encounter (HOSPITAL_COMMUNITY): Payer: 59 | Admitting: Psychiatry

## 2011-01-02 ENCOUNTER — Ambulatory Visit (INDEPENDENT_AMBULATORY_CARE_PROVIDER_SITE_OTHER): Payer: 59 | Admitting: Psychiatry

## 2011-01-02 DIAGNOSIS — F329 Major depressive disorder, single episode, unspecified: Secondary | ICD-10-CM

## 2011-01-02 DIAGNOSIS — F1220 Cannabis dependence, uncomplicated: Secondary | ICD-10-CM

## 2011-01-02 NOTE — Progress Notes (Signed)
Patient:  Anthony Morales   DOB: Jul 21, 1985  MR Number: [id number]  Location: [facility]:  [address] [place],  [place], [address]  Start: Thursday 01/02/2011 End: Thursday 01/02/2011  Provider/Observer:     Florencia Reasons, MSW, LCSW   Chief Complaint:      Chief Complaint  Patient presents with  . Depression    Reason For Service:     The patient is seeking services due to experiencing depression and anger issues. He states" I want to find a way to help deal with things without getting mad". He reports a tendency to become angry over small issues and to run things but feeling bad after the episode. He reports becoming angry once every 2-3 weeks. He also reports feeling depressed most of the time and states poor motivation, no interest in activities, no energy, and just stated to be at for 2-3 days at a time. He also reports experiencing panic attacks about one time a week.  Interventions Strategy:  Supportive therapy  Participation Level:   Active  Participation Quality:  Appropriate      Behavioral Observation:  Fairly Groomed, Alert, and Appropriate.   Current Psychosocial Factors: The patient has been unemployed for about a year and a half and reports financial stress. Patient also reports stress related to an upcoming appointment for a colonoscopy next week..  Content of Session:   Reviewing symptoms, identifying stressors, identifying support system, exploring relaxation techniques  Current Status:    The patient reports being less depressed but continuing to experience anxiety. He also reports a decreased interest in activities.  Patient Progress:   Fair. The patient reports his mood has fluctuated since last session. He has had 1-1/2 good days out of the past 7 days. He reports he hasn't had any anger outbursts. He also reports increased involvement in activities including recently going fishing, playing his guitar, and attending his son's soccer game. He reports  continued stomach pain and expresses anxiety about having a colonoscopy next week. The therapist works with patient to explore relaxation techniques and to practice diaphragmatic breathing. He reports continued daily marijuana use and states spending $40-$60 per week for the marijuana. He reports the marijuana helps him to relax.  Target Goals:   Improve coping skills, decrease anxiety  Last Reviewed:   01/02/2011  Goals Addressed Today:    Improve coping skills, decrease anxiety  Impression/Diagnosis:   The patient presents with a history of ADHD diagnosed in the fifth grade and experiencing symptoms of depression since childhood. Patient currently is experiencing depressed mood, low energy, irritability, loss of interest, poor motivation, anxiety, panic attacks, and excessive worry. He also has a history of past alcohol abuse/dependence. Patient also reports marijuana use daily since age 46. Diagnosis: Depressive disorder NOS, anxiety disorder NOS, and cannabis dependence.  Diagnosis:  Axis I:  1. Depressive disorder   2. Cannabis dependence             Axis II: Deferred

## 2011-01-02 NOTE — Patient Instructions (Signed)
Practice relaxation breathing 

## 2011-01-07 ENCOUNTER — Ambulatory Visit (AMBULATORY_SURGERY_CENTER): Payer: 59 | Admitting: Internal Medicine

## 2011-01-07 ENCOUNTER — Encounter: Payer: 59 | Admitting: Internal Medicine

## 2011-01-07 ENCOUNTER — Encounter: Payer: Self-pay | Admitting: Internal Medicine

## 2011-01-07 DIAGNOSIS — K625 Hemorrhage of anus and rectum: Secondary | ICD-10-CM

## 2011-01-07 DIAGNOSIS — R109 Unspecified abdominal pain: Secondary | ICD-10-CM

## 2011-01-07 MED ORDER — SODIUM CHLORIDE 0.9 % IV SOLN: 500.0000 mL | INTRAVENOUS | Status: DC

## 2011-01-07 NOTE — Patient Instructions (Signed)
Please refer to blue and green discharge instruction sheets. 

## 2011-01-08 ENCOUNTER — Telehealth: Payer: Self-pay | Admitting: *Deleted

## 2011-01-08 NOTE — Telephone Encounter (Signed)

## 2011-01-14 ENCOUNTER — Ambulatory Visit (HOSPITAL_COMMUNITY): Payer: 59 | Admitting: Psychiatry

## 2016-04-18 DIAGNOSIS — R319 Hematuria, unspecified: Secondary | ICD-10-CM | POA: Diagnosis not present

## 2016-04-18 DIAGNOSIS — R109 Unspecified abdominal pain: Secondary | ICD-10-CM | POA: Diagnosis not present

## 2016-04-21 DIAGNOSIS — R109 Unspecified abdominal pain: Secondary | ICD-10-CM | POA: Diagnosis not present

## 2016-05-06 DIAGNOSIS — R109 Unspecified abdominal pain: Secondary | ICD-10-CM | POA: Diagnosis not present

## 2016-05-06 DIAGNOSIS — R1084 Generalized abdominal pain: Secondary | ICD-10-CM | POA: Diagnosis not present

## 2016-05-14 DIAGNOSIS — K649 Unspecified hemorrhoids: Secondary | ICD-10-CM | POA: Diagnosis not present

## 2016-05-14 DIAGNOSIS — R1084 Generalized abdominal pain: Secondary | ICD-10-CM | POA: Diagnosis not present

## 2016-05-14 DIAGNOSIS — K625 Hemorrhage of anus and rectum: Secondary | ICD-10-CM | POA: Diagnosis not present

## 2016-06-03 DIAGNOSIS — K648 Other hemorrhoids: Secondary | ICD-10-CM | POA: Diagnosis not present

## 2016-06-30 DIAGNOSIS — K648 Other hemorrhoids: Secondary | ICD-10-CM | POA: Diagnosis not present

## 2017-01-12 DIAGNOSIS — R0602 Shortness of breath: Secondary | ICD-10-CM | POA: Diagnosis not present

## 2017-01-12 DIAGNOSIS — F17200 Nicotine dependence, unspecified, uncomplicated: Secondary | ICD-10-CM | POA: Diagnosis not present

## 2017-01-12 DIAGNOSIS — K219 Gastro-esophageal reflux disease without esophagitis: Secondary | ICD-10-CM | POA: Diagnosis not present

## 2017-01-12 DIAGNOSIS — R05 Cough: Secondary | ICD-10-CM | POA: Diagnosis not present

## 2017-01-12 DIAGNOSIS — Z720 Tobacco use: Secondary | ICD-10-CM | POA: Diagnosis not present

## 2017-01-12 DIAGNOSIS — Z79899 Other long term (current) drug therapy: Secondary | ICD-10-CM | POA: Diagnosis not present

## 2017-01-12 DIAGNOSIS — J209 Acute bronchitis, unspecified: Secondary | ICD-10-CM | POA: Diagnosis not present

## 2017-01-12 DIAGNOSIS — J0100 Acute maxillary sinusitis, unspecified: Secondary | ICD-10-CM | POA: Diagnosis not present

## 2017-09-10 ENCOUNTER — Telehealth: Payer: Self-pay | Admitting: Internal Medicine

## 2017-09-10 NOTE — Telephone Encounter (Signed)
Hi Dr. Rhea BeltonPyrtle, we have received patients GI records from [facility]. He would like to transfer his care back to us. You saw patient in 2012. He did not provide a specific reason just stated that he needed help. Records have been placed on your desk for review. Thank you.

## 2017-09-14 NOTE — Telephone Encounter (Signed)
Dr. Rhea BeltonPyrtle reviewed records and okay to schedule OV. Dr. Lauro FranklinPyrtle's schedule is full and explained we would call him to schedule as soon as the October calender is available.

## 2017-09-16 ENCOUNTER — Encounter: Payer: Self-pay | Admitting: Gastroenterology

## 2017-10-06 ENCOUNTER — Telehealth: Payer: Self-pay

## 2017-10-06 ENCOUNTER — Ambulatory Visit (INDEPENDENT_AMBULATORY_CARE_PROVIDER_SITE_OTHER): Payer: BLUE CROSS/BLUE SHIELD | Admitting: Gastroenterology

## 2017-10-06 ENCOUNTER — Encounter (INDEPENDENT_AMBULATORY_CARE_PROVIDER_SITE_OTHER): Payer: Self-pay

## 2017-10-06 ENCOUNTER — Encounter: Payer: Self-pay | Admitting: Gastroenterology

## 2017-10-06 VITALS — BP 122/80 | HR 80 | Ht 67.25 in | Wt 123.0 lb

## 2017-10-06 DIAGNOSIS — K602 Anal fissure, unspecified: Secondary | ICD-10-CM | POA: Diagnosis not present

## 2017-10-06 DIAGNOSIS — K648 Other hemorrhoids: Secondary | ICD-10-CM | POA: Diagnosis not present

## 2017-10-06 MED ORDER — AMBULATORY NON FORMULARY MEDICATION: 1 refills | Status: DC

## 2017-10-06 MED ORDER — AMBULATORY NON FORMULARY MEDICATION: 0 refills | Status: AC

## 2017-10-06 MED ORDER — TRAMADOL HCL 50 MG PO TABS: 50.0000 mg | ORAL_TABLET | Freq: Three times a day (TID) | ORAL | 0 refills | Status: DC | PRN

## 2017-10-06 NOTE — Telephone Encounter (Signed)
Referral to [facility]) for large, prolapsed, internal hemorrhoids sent within [facility].

## 2017-10-06 NOTE — Patient Instructions (Addendum)
If you are age 31 or older, your body mass index should be between 23-30. Your Body mass index is 19.12 kg/m. If this is out of the aforementioned range listed, please consider follow up with your Primary Care Provider.  If you are age 31 or younger, your body mass index should be between 19-25. Your Body mass index is 19.12 kg/m. If this is out of the aformentioned range listed, please consider follow up with your Primary Care Provider.   We have sent a prescription for nitroglycerin 0.125% gel to [facility]. You should apply a pea size amount to your rectum two times daily x 8 to 10 weeks. Their number is [phone].  They are located [address] in [place], [place]  *Please DO NOT go directly from our office to pick up this medication! Give the pharmacy 1 day to process the prescription as this is compounded at takes time to make.    We have sent the following medications to your pharmacy for you to pick up at your convenience: Tramadol: Take every 8 hours as needed (Tablet may be crushed)  Please purchase Miralax over the counter and take daily.  We will [facility]. They will call you to schedule and appointment.  If you have not heard back from them within 2 weeks please call and let us know.    Thank you for entrusting me with your care and for choosing [facility], Doug SouJessica Zehr, P.A.-C

## 2017-10-06 NOTE — Progress Notes (Addendum)
10/06/2017 Anthony Morales [id number] 21-Jan-1986   HISTORY OF PRESENT ILLNESS: This is a 32 year old male who was previously seen by Dr. Rhea BeltonPyrtle in 2012 with complaints of rectal bleeding.  He had a colonoscopy in 2012 at which time he was found to have large internal hemorrhoids.  It was recommended that he have surgical evaluation if he had continue issues and complaints with them.  He has continued to have ongoing issues with hemorrhoids, but they seem to be worsening.  He complains of a lot of prolapse and swelling, saying that he is having increasing difficulty with reducing them.  He reports being seen at [facility] in 2018 where he had attempt at hemorrhoid bands placed x 2.  He says that those bands both fell off prematurely and had caused him excruciating pain.  Looks like they recommended surgical referral at that point as well.  Over the past couple of months he also reports excruciating rectal pain that keeps him up at night.  Has intermittent rectal bleeding as well.  Denies any constipation.  Says that he has been taking fiber tablets and that allows his stools to remain soft.  He reports that he was scared about surgery previously and was also worried about cost.   Past Medical History:  Diagnosis Date  . Acid reflux   . ADD (attention deficit disorder with hyperactivity)   . Alcoholism    quit a year ago  . Anxiety   . Constipation   . Depression   . External hemorrhoid, thrombosed   . Rectal bleeding   . Substance abuse    Past Surgical History:  Procedure Laterality Date  . HEMORRHOID SURGERY    . WISDOM TOOTH EXTRACTION      reports that he has been smoking cigarettes. He has a 10.00 pack-year smoking history. He has never used smokeless tobacco. He reports that he has current or past drug history. Drug: Marijuana. Frequency: 14.00 times per week. He reports that he does not drink alcohol. family history includes Alcohol abuse in his mother; Clotting disorder in his  maternal grandmother; Depression in his mother; Hypertension in his father. No Known Allergies    Outpatient Encounter Medications as of 10/06/2017  Medication Sig  . ALPRAZolam (XANAX) 0.5 MG tablet Take 1 mg by mouth Twice daily.   Marland Kitchen. amphetamine-dextroamphetamine (ADDERALL) 30 MG tablet Take 1 tablet by mouth Twice daily.  Marland Kitchen. esomeprazole (NEXIUM) 40 MG capsule Take 40 mg by mouth as needed.   . loratadine (CLARITIN) 10 MG tablet Take 10 mg by mouth daily.    . mometasone (NASONEX) 50 MCG/ACT nasal spray Place 1 spray into the nose daily.    . Pseudoephedrine HCl (SUDAFED PO) Take by mouth. 1 every four hours    No facility-administered encounter medications on file as of 10/06/2017.      REVIEW OF SYSTEMS  : All other systems reviewed and negative except where noted in the History of Present Illness.   PHYSICAL EXAM: Ht 5' 7.25" (1.708 m) Comment: height measured without shoes  Wt 123 lb (55.8 kg)   BMI 19.12 kg/m  General: Well developed white male in no acute distress Head: Normocephalic and atraumatic Eyes:  Sclerae anicteric, conjunctiva pink. Ears: Normal auditory acuity Lungs: Clear throughout to auscultation; no increased WOB. Heart: Regular rate and rhythm; no M/R/G. Abdomen: Soft, non-distended.  BS present.  Non-tender. Rectal:  No external abnormalities noted.  DRE revealed no masses but there was extreme tenderness posteriorly.  Musculoskeletal: Symmetrical with no gross deformities  Skin: No lesions on visible extremities Extremities: No edema  Neurological: Alert oriented x 4, grossly non-focal Psychological:  Alert and cooperative. Normal mood and affect  ASSESSMENT AND PLAN: *Anal fissure:  Will treat with nitro gel 0.125% BID internally to first knuckle x 6-10 weeks.  He is not able to use topical lidocaine so will give tramadol for pain, but reminded him to use sparingly.  Will start Miralax daily as well to keep stools soft. Large prolapsed internal  hemorrhoids:  Not identified on exam today but long-standing history of internal hemorrhoids as identified on previous colonoscopy in 2012 with recommendation for surgical evaluation at that time.  Will refer to [facility] for evaluation.   CC:  Kari BaarsHawkins, Edward, MD  Addendum: Reviewed and agree with management. Pyrtle, Carie CaddyJay M, MD

## 2017-10-07 ENCOUNTER — Telehealth: Payer: Self-pay | Admitting: Gastroenterology

## 2017-10-07 NOTE — Telephone Encounter (Signed)
referral to [facility] has been made, he will call if he has not received a call back by Monday regarding appt.  He says his pain is keeping him up at night and tramadol is not helping.  I advised him to continue nitro gel and miralax to keep stools soft.

## 2017-10-22 ENCOUNTER — Telehealth: Payer: Self-pay | Admitting: Gastroenterology

## 2017-10-22 NOTE — Telephone Encounter (Signed)
Pt calling back states it has been 2 weeks and has not heard back from [facility] best call back #[phone].

## 2017-10-22 NOTE — Telephone Encounter (Signed)
The pt will research and find the name of a general surgeon he would like to see and call back for us to refer to that office.

## 2017-10-22 NOTE — Telephone Encounter (Signed)
Called [facility].  They have a billing question for the patient.  Asked to have him call them to discuss and once resolved they can get him scheduled.  Called the pt and gave him the number for Sarah at [facility]: [phone].  He understands to call her.

## 2017-11-04 DIAGNOSIS — J209 Acute bronchitis, unspecified: Secondary | ICD-10-CM | POA: Diagnosis not present

## 2017-11-04 DIAGNOSIS — J029 Acute pharyngitis, unspecified: Secondary | ICD-10-CM | POA: Diagnosis not present

## 2017-11-04 DIAGNOSIS — J069 Acute upper respiratory infection, unspecified: Secondary | ICD-10-CM | POA: Diagnosis not present

## 2017-11-23 DIAGNOSIS — K602 Anal fissure, unspecified: Secondary | ICD-10-CM | POA: Diagnosis not present

## 2018-01-08 ENCOUNTER — Ambulatory Visit: Payer: Self-pay | Admitting: General Surgery

## 2018-01-08 DIAGNOSIS — K648 Other hemorrhoids: Secondary | ICD-10-CM | POA: Diagnosis not present

## 2018-01-08 NOTE — H&P (Signed)
History of Present Illness Anthony Morales(Dong Nimmons MD; 01/08/2018 12:35 PM) The patient is a 32 year old male who presents with anal pain. This patient was seen in consultation from Doug SouJessica Zehr, [place]. 32 year old male with partially four-year history of anal pain and bleeding treated as hemorrhoids. He is status post one hemorrhoid thrombectomy and 2 rubber band ligations. Both times he has significant pain with a rubber band ligation and did not get much benefit from it. He was seen at [facility] and was thought to have a fissure. He had been using nitroglycerin cream for the past 4-5 weeks but has not noticed any difference in his symptoms. He describes significant pain with bowel movements as well as occasional bleeding. His last colonoscopy was approximately 5 years ago and normal per patient report. He currently is having regular bowel movements using MiraLAX and denies any straining. He was seen in the office approximately 6 weeks ago and noted to have a posterior midline anal fissure. I recommended a round of diltiazem ointment. He has completed this and it sounds like his pain with bowel movements is much better but he continues to have prolapsing tissue with bowel movements. This has to be manually reduced.   Problem List/Past Medical Anthony Morales(Belinda Bringhurst, MD; 01/08/2018 12:31 PM) ANAL FISSURE (K60.2) INTERNAL HEMORRHOIDS WITH COMPLICATION (K64.8)  Past Surgical History Anthony Morales(Ilah Boule, MD; 01/08/2018 12:31 PM) Hemorrhoidectomy  Diagnostic Studies History Anthony Morales(Melady Chow, MD; 01/08/2018 12:31 PM) Colonoscopy 5-10 years ago  Allergies (Tanisha A. Manson PasseyBrown, RMA; 01/08/2018 12:14 PM) No Known Drug Allergies [06/03/2016]: Allergies Reconciled  Medication History (Tanisha A. Manson PasseyBrown, RMA; 01/08/2018 12:14 PM) MiraLax (Oral) Active. Goody Headache (Oral) Specific strength unknown - Active. Ibuprofen (200MG  Tablet, Oral) Active. controlled Substance Active. Medications Reconciled  Social  History Anthony Morales(Brendaliz Kuk, MD; 01/08/2018 12:31 PM) Alcohol use Remotely quit alcohol use. Caffeine use Carbonated beverages, Tea. Illicit drug use Prefer to discuss with provider. Tobacco use Current every day smoker.  Family History Anthony Morales(Nicolae Vasek, MD; 01/08/2018 12:31 PM) Alcohol Abuse Mother. Hypertension Father. Thyroid problems Mother.  Other Problems Anthony Morales(Alayja Armas, MD; 01/08/2018 12:31 PM) Anxiety Disorder Back Pain Hemorrhoids     Review of Systems Anthony Morales(Alekhya Gravlin MD; 01/08/2018 12:31 PM) General Not Present- Appetite Loss, Chills, Fatigue, Fever, Night Sweats, Weight Gain and Weight Loss. Skin Not Present- Change in Wart/Mole, Dryness, Hives, Jaundice, New Lesions, Non-Healing Wounds, Rash and Ulcer. HEENT Present- Wears glasses/contact lenses. Not Present- Earache, Hearing Loss, Hoarseness, Nose Bleed, Oral Ulcers, Ringing in the Ears, Seasonal Allergies, Sinus Pain, Sore Throat, Visual Disturbances and Yellow Eyes. Respiratory Not Present- Bloody sputum, Chronic Cough, Difficulty Breathing, Snoring and Wheezing. Breast Not Present- Breast Mass, Breast Pain, Nipple Discharge and Skin Changes. Cardiovascular Not Present- Chest Pain, Difficulty Breathing Lying Down, Leg Cramps, Palpitations, Rapid Heart Rate, Shortness of Breath and Swelling of Extremities. Gastrointestinal Present- Bloody Stool, Hemorrhoids and Rectal Pain. Not Present- Abdominal Pain, Bloating, Change in Bowel Habits, Chronic diarrhea, Constipation, Difficulty Swallowing, Excessive gas, Gets full quickly at meals, Indigestion, Nausea and Vomiting. Male Genitourinary Not Present- Blood in Urine, Change in Urinary Stream, Frequency, Impotence, Nocturia, Painful Urination, Urgency and Urine Leakage. Musculoskeletal Present- Back Pain. Not Present- Joint Pain, Joint Stiffness, Muscle Pain, Muscle Weakness and Swelling of Extremities. Neurological Not Present- Decreased Memory, Fainting, Headaches, Numbness,  Seizures, Tingling, Tremor, Trouble walking and Weakness. Psychiatric Present- Anxiety. Not Present- Bipolar, Change in Sleep Pattern, Depression, Fearful and Frequent crying. Endocrine Not Present- Cold Intolerance, Excessive Hunger, Hair Changes, Heat Intolerance and New Diabetes.  Hematology Not Present- Blood Thinners, Easy Bruising, Excessive bleeding, Gland problems, HIV and Persistent Infections.  Vitals (Tanisha A. Brown RMA; 01/08/2018 12:14 PM) 01/08/2018 12:13 PM Weight: 122 lb Height: 70in Body Surface Area: 1.69 m Body Mass Index: 17.5 kg/m  Temp.: 98.39F  Pulse: 100 (Regular)  BP: 120/78 (Sitting, Left Arm, Standard)      Physical Exam Anthony Levee MD; 01/08/2018 12:32 PM)  The physical exam findings are as follows: Note:GENERAL APPEARANCE: WDWN in NAD. Pleasant and cooperative.  EARS, NOSE, MOUTH THROAT: [place]/AT external ears: no lesions or deformities external nose: no lesions or deformities hearing: grossly normal lips: moist, no deformities EYES external: conjunctiva, lids, sclerae normal pupils: equal, round glasses: no  GASTROINTESTINAL anorectal: Slight external swellings right anterior and posterior positions, no fissures, normal sphincter tone, no masses or blood on DRE  NEUROLOGIC speech: normal  PSYCHIATRIC alertness and orientation: normal mood/affect/behavior: normal judgement and insight: normal  Rectal: posterior midline anal fissure, healing well. significant TTP entire anal canal    Assessment & Plan Anthony Levee MD; 01/08/2018 12:37 PM)  INTERNAL HEMORRHOIDS WITH COMPLICATION (K64.8) Impression: 32 year old male who complains of prolapsing hemorrhoids and anal pain. He was treated approximately 6 weeks ago with diltiazem ointment for a posterior midline fissure. On exam today this has healed significantly. I was unable to complete a thorough anal exam due to pain. I have recommended an anal exam under  anesthesia with possible hemorrhoidal pexy versus hemorrhoidectomy and possible chemical sphincterotomy. We discussed that I will not be able to tell me exactly what I would be able to do until I saw his exam in the operating room. His postop recovery would be dependent determined on how extensive his hemorrhoidal surgery was. Risks include bleeding pain recurrence.

## 2018-01-08 NOTE — H&P (View-Only) (Signed)
History of Present Illness (Mahalie Kanner MD; 01/08/2018 12:35 PM) The patient is a 32 year old male who presents with anal pain. This patient was seen in consultation from Jessica Zehr, PA. 32-year-old male with partially four-year history of anal pain and bleeding treated as hemorrhoids. He is status post one hemorrhoid thrombectomy and 2 rubber band ligations. Both times he has significant pain with a rubber band ligation and did not get much benefit from it. He was seen at [facility] and was thought to have a fissure. He had been using nitroglycerin cream for the past 4-5 weeks but has not noticed any difference in his symptoms. He describes significant pain with bowel movements as well as occasional bleeding. His last colonoscopy was approximately 5 years ago and normal per patient report. He currently is having regular bowel movements using MiraLAX and denies any straining. He was seen in the office approximately 6 weeks ago and noted to have a posterior midline anal fissure. I recommended a round of diltiazem ointment. He has completed this and it sounds like his pain with bowel movements is much better but he continues to have prolapsing tissue with bowel movements. This has to be manually reduced.   Problem List/Past Medical (Jalayia Bagheri, MD; 01/08/2018 12:31 PM) ANAL FISSURE (K60.2) INTERNAL HEMORRHOIDS WITH COMPLICATION (K64.8)  Past Surgical History (Jimeka Balan, MD; 01/08/2018 12:31 PM) Hemorrhoidectomy  Diagnostic Studies History (Nicandro Perrault, MD; 01/08/2018 12:31 PM) Colonoscopy 5-10 years ago  Allergies (Tanisha A. Brown, RMA; 01/08/2018 12:14 PM) No Known Drug Allergies [06/03/2016]: Allergies Reconciled  Medication History (Tanisha A. Brown, RMA; 01/08/2018 12:14 PM) MiraLax (Oral) Active. Goody Headache (Oral) Specific strength unknown - Active. Ibuprofen (200MG Tablet, Oral) Active. controlled Substance Active. Medications Reconciled  Social  History (Egypt Marchiano, MD; 01/08/2018 12:31 PM) Alcohol use Remotely quit alcohol use. Caffeine use Carbonated beverages, Tea. Illicit drug use Prefer to discuss with provider. Tobacco use Current every day smoker.  Family History (Onyx Edgley, MD; 01/08/2018 12:31 PM) Alcohol Abuse Mother. Hypertension Father. Thyroid problems Mother.  Other Problems (Keela Rubert, MD; 01/08/2018 12:31 PM) Anxiety Disorder Back Pain Hemorrhoids     Review of Systems (Almadelia Looman MD; 01/08/2018 12:31 PM) General Not Present- Appetite Loss, Chills, Fatigue, Fever, Night Sweats, Weight Gain and Weight Loss. Skin Not Present- Change in Wart/Mole, Dryness, Hives, Jaundice, New Lesions, Non-Healing Wounds, Rash and Ulcer. HEENT Present- Wears glasses/contact lenses. Not Present- Earache, Hearing Loss, Hoarseness, Nose Bleed, Oral Ulcers, Ringing in the Ears, Seasonal Allergies, Sinus Pain, Sore Throat, Visual Disturbances and Yellow Eyes. Respiratory Not Present- Bloody sputum, Chronic Cough, Difficulty Breathing, Snoring and Wheezing. Breast Not Present- Breast Mass, Breast Pain, Nipple Discharge and Skin Changes. Cardiovascular Not Present- Chest Pain, Difficulty Breathing Lying Down, Leg Cramps, Palpitations, Rapid Heart Rate, Shortness of Breath and Swelling of Extremities. Gastrointestinal Present- Bloody Stool, Hemorrhoids and Rectal Pain. Not Present- Abdominal Pain, Bloating, Change in Bowel Habits, Chronic diarrhea, Constipation, Difficulty Swallowing, Excessive gas, Gets full quickly at meals, Indigestion, Nausea and Vomiting. Male Genitourinary Not Present- Blood in Urine, Change in Urinary Stream, Frequency, Impotence, Nocturia, Painful Urination, Urgency and Urine Leakage. Musculoskeletal Present- Back Pain. Not Present- Joint Pain, Joint Stiffness, Muscle Pain, Muscle Weakness and Swelling of Extremities. Neurological Not Present- Decreased Memory, Fainting, Headaches, Numbness,  Seizures, Tingling, Tremor, Trouble walking and Weakness. Psychiatric Present- Anxiety. Not Present- Bipolar, Change in Sleep Pattern, Depression, Fearful and Frequent crying. Endocrine Not Present- Cold Intolerance, Excessive Hunger, Hair Changes, Heat Intolerance and New Diabetes.   Hematology Not Present- Blood Thinners, Easy Bruising, Excessive bleeding, Gland problems, HIV and Persistent Infections.  Vitals (Tanisha A. Brown RMA; 01/08/2018 12:14 PM) 01/08/2018 12:13 PM Weight: 122 lb Height: 70in Body Surface Area: 1.69 m Body Mass Index: 17.5 kg/m  Temp.: 98.4F  Pulse: 100 (Regular)  BP: 120/78 (Sitting, Left Arm, Standard)      Physical Exam (Aswad Wandrey MD; 01/08/2018 12:32 PM)  The physical exam findings are as follows: Note:GENERAL APPEARANCE: WDWN in NAD. Pleasant and cooperative.  EARS, NOSE, MOUTH THROAT: [place]/AT external ears: no lesions or deformities external nose: no lesions or deformities hearing: grossly normal lips: moist, no deformities EYES external: conjunctiva, lids, sclerae normal pupils: equal, round glasses: no  GASTROINTESTINAL anorectal: Slight external swellings right anterior and posterior positions, no fissures, normal sphincter tone, no masses or blood on DRE  NEUROLOGIC speech: normal  PSYCHIATRIC alertness and orientation: normal mood/affect/behavior: normal judgement and insight: normal  Rectal: posterior midline anal fissure, healing well. significant TTP entire anal canal    Assessment & Plan (Lataria Courser MD; 01/08/2018 12:37 PM)  INTERNAL HEMORRHOIDS WITH COMPLICATION (K64.8) Impression: 32-year-old male who complains of prolapsing hemorrhoids and anal pain. He was treated approximately 6 weeks ago with diltiazem ointment for a posterior midline fissure. On exam today this has healed significantly. I was unable to complete a thorough anal exam due to pain. I have recommended an anal exam under  anesthesia with possible hemorrhoidal pexy versus hemorrhoidectomy and possible chemical sphincterotomy. We discussed that I will not be able to tell me exactly what I would be able to do until I saw his exam in the operating room. His postop recovery would be dependent determined on how extensive his hemorrhoidal surgery was. Risks include bleeding pain recurrence. 

## 2018-01-19 ENCOUNTER — Encounter (HOSPITAL_BASED_OUTPATIENT_CLINIC_OR_DEPARTMENT_OTHER): Payer: Self-pay | Admitting: *Deleted

## 2018-01-19 ENCOUNTER — Other Ambulatory Visit: Payer: Self-pay

## 2018-01-19 NOTE — Progress Notes (Signed)
Spoke with Anthony Morales after midnight, arrive 930 am 01-27-18 [facility] to take sip of water: alprazolam prn, es tylenol prn Driver wife breann No labs needed Has surgery orders in epic

## 2018-01-27 ENCOUNTER — Encounter (HOSPITAL_BASED_OUTPATIENT_CLINIC_OR_DEPARTMENT_OTHER): Payer: Self-pay | Admitting: *Deleted

## 2018-01-27 ENCOUNTER — Encounter (HOSPITAL_BASED_OUTPATIENT_CLINIC_OR_DEPARTMENT_OTHER)
Admission: RE | Disposition: A | Discharge status: Home / Self Care | Payer: Self-pay | Source: Ambulatory Visit | Attending: General Surgery

## 2018-01-27 ENCOUNTER — Ambulatory Visit (HOSPITAL_BASED_OUTPATIENT_CLINIC_OR_DEPARTMENT_OTHER)
Admission: RE | Admit: 2018-01-27 | Discharge: 2018-01-27 | Disposition: A | Discharge status: Home / Self Care | Payer: BLUE CROSS/BLUE SHIELD | Source: Ambulatory Visit | Attending: General Surgery | Admitting: General Surgery

## 2018-01-27 ENCOUNTER — Ambulatory Visit (HOSPITAL_BASED_OUTPATIENT_CLINIC_OR_DEPARTMENT_OTHER): Payer: BLUE CROSS/BLUE SHIELD | Admitting: Anesthesiology

## 2018-01-27 DIAGNOSIS — K642 Third degree hemorrhoids: Secondary | ICD-10-CM | POA: Insufficient documentation

## 2018-01-27 DIAGNOSIS — K602 Anal fissure, unspecified: Secondary | ICD-10-CM | POA: Insufficient documentation

## 2018-01-27 DIAGNOSIS — K601 Chronic anal fissure: Secondary | ICD-10-CM | POA: Diagnosis not present

## 2018-01-27 DIAGNOSIS — K219 Gastro-esophageal reflux disease without esophagitis: Secondary | ICD-10-CM | POA: Diagnosis not present

## 2018-01-27 DIAGNOSIS — Z79899 Other long term (current) drug therapy: Secondary | ICD-10-CM | POA: Diagnosis not present

## 2018-01-27 DIAGNOSIS — F419 Anxiety disorder, unspecified: Secondary | ICD-10-CM | POA: Diagnosis not present

## 2018-01-27 HISTORY — PX: SPHINCTEROTOMY: SHX5279

## 2018-01-27 HISTORY — PX: HEMORRHOID SURGERY: SHX153

## 2018-01-27 HISTORY — DX: Anal fissure, unspecified: K60.2

## 2018-01-27 HISTORY — PX: RECTAL EXAM UNDER ANESTHESIA: SHX6399

## 2018-01-27 SURGERY — EXAM UNDER ANESTHESIA, RECTUM
Anesthesia: Monitor Anesthesia Care

## 2018-01-27 MED ORDER — DEXAMETHASONE SODIUM PHOSPHATE 10 MG/ML IJ SOLN
INTRAMUSCULAR | Status: AC
  Filled 2018-01-27: qty 1

## 2018-01-27 MED ORDER — OXYCODONE HCL 5 MG PO TABS: 5.0000 mg | ORAL_TABLET | Freq: Four times a day (QID) | ORAL | 0 refills | Status: DC | PRN

## 2018-01-27 MED ORDER — MIDAZOLAM HCL 2 MG/2ML IJ SOLN
INTRAMUSCULAR | Status: AC
  Filled 2018-01-27: qty 2

## 2018-01-27 MED ORDER — PROPOFOL 500 MG/50ML IV EMUL
INTRAVENOUS | Status: DC | PRN
  Administered 2018-01-27: 150 ug/kg/min via INTRAVENOUS

## 2018-01-27 MED ORDER — DEXAMETHASONE SODIUM PHOSPHATE 4 MG/ML IJ SOLN
INTRAMUSCULAR | Status: DC | PRN
  Administered 2018-01-27: 4 mg via INTRAVENOUS

## 2018-01-27 MED ORDER — OXYCODONE HCL 5 MG/5ML PO SOLN
5.0000 mg | Freq: Once | ORAL | Status: AC | PRN
  Filled 2018-01-27: qty 5

## 2018-01-27 MED ORDER — OXYCODONE HCL 5 MG PO TABS
5.0000 mg | ORAL_TABLET | Freq: Once | ORAL | Status: AC | PRN
  Administered 2018-01-27: 5 mg via ORAL
  Filled 2018-01-27: qty 1

## 2018-01-27 MED ORDER — PROPOFOL 500 MG/50ML IV EMUL
INTRAVENOUS | Status: AC
  Filled 2018-01-27: qty 50

## 2018-01-27 MED ORDER — FENTANYL CITRATE (PF) 100 MCG/2ML IJ SOLN
INTRAMUSCULAR | Status: AC
  Filled 2018-01-27: qty 2

## 2018-01-27 MED ORDER — LIDOCAINE 5 % EX OINT
TOPICAL_OINTMENT | CUTANEOUS | Status: DC | PRN
  Administered 2018-01-27: 1

## 2018-01-27 MED ORDER — BUPIVACAINE-EPINEPHRINE 0.5% -1:200000 IJ SOLN
INTRAMUSCULAR | Status: DC | PRN
  Administered 2018-01-27: 30 mL

## 2018-01-27 MED ORDER — SODIUM CHLORIDE (PF) 0.9 % IJ SOLN
INTRAMUSCULAR | Status: DC | PRN
  Administered 2018-01-27: 50 mL

## 2018-01-27 MED ORDER — LIDOCAINE HCL (CARDIAC) PF 100 MG/5ML IV SOSY
PREFILLED_SYRINGE | INTRAVENOUS | Status: DC | PRN
  Administered 2018-01-27: 50 mg via INTRAVENOUS

## 2018-01-27 MED ORDER — CELECOXIB 200 MG PO CAPS
200.0000 mg | ORAL_CAPSULE | ORAL | Status: DC
  Filled 2018-01-27: qty 1

## 2018-01-27 MED ORDER — ONDANSETRON HCL 4 MG/2ML IJ SOLN
INTRAMUSCULAR | Status: AC
  Filled 2018-01-27: qty 2

## 2018-01-27 MED ORDER — BUPIVACAINE LIPOSOME 1.3 % IJ SUSP
INTRAMUSCULAR | Status: DC | PRN
  Administered 2018-01-27: 20 mL

## 2018-01-27 MED ORDER — ONDANSETRON HCL 4 MG/2ML IJ SOLN
4.0000 mg | Freq: Four times a day (QID) | INTRAMUSCULAR | Status: DC | PRN
  Filled 2018-01-27: qty 2

## 2018-01-27 MED ORDER — GABAPENTIN 300 MG PO CAPS
300.0000 mg | ORAL_CAPSULE | ORAL | Status: DC
  Filled 2018-01-27: qty 1

## 2018-01-27 MED ORDER — LACTATED RINGERS IV SOLN
INTRAVENOUS | Status: DC
  Administered 2018-01-27: 1000 mL via INTRAVENOUS
  Administered 2018-01-27: 10:00:00 via INTRAVENOUS
  Filled 2018-01-27: qty 1000

## 2018-01-27 MED ORDER — ONDANSETRON HCL 4 MG/2ML IJ SOLN
INTRAMUSCULAR | Status: DC | PRN
  Administered 2018-01-27: 4 mg via INTRAVENOUS

## 2018-01-27 MED ORDER — OXYCODONE HCL 5 MG/5ML PO SOLN: 5.0000 mg | Freq: Four times a day (QID) | ORAL | 0 refills | Status: AC | PRN

## 2018-01-27 MED ORDER — OXYCODONE HCL 5 MG PO TABS
ORAL_TABLET | ORAL | Status: AC
  Filled 2018-01-27: qty 1

## 2018-01-27 MED ORDER — FENTANYL CITRATE (PF) 100 MCG/2ML IJ SOLN
25.0000 ug | INTRAMUSCULAR | Status: DC | PRN
  Administered 2018-01-27 (×2): 25 ug via INTRAVENOUS
  Filled 2018-01-27: qty 1

## 2018-01-27 MED ORDER — ACETAMINOPHEN 500 MG PO TABS
1000.0000 mg | ORAL_TABLET | ORAL | Status: DC
  Filled 2018-01-27: qty 2

## 2018-01-27 MED ORDER — PROPOFOL 10 MG/ML IV BOLUS
INTRAVENOUS | Status: DC | PRN
  Administered 2018-01-27 (×2): 50 mg via INTRAVENOUS

## 2018-01-27 MED ORDER — MIDAZOLAM HCL 5 MG/5ML IJ SOLN
INTRAMUSCULAR | Status: DC | PRN
  Administered 2018-01-27: 2 mg via INTRAVENOUS
  Administered 2018-01-27 (×2): 1 mg via INTRAVENOUS

## 2018-01-27 MED ORDER — BUPIVACAINE LIPOSOME 1.3 % IJ SUSP
20.0000 mL | Freq: Once | INTRAMUSCULAR | Status: DC
  Filled 2018-01-27: qty 20

## 2018-01-27 MED ORDER — LIDOCAINE 2% (20 MG/ML) 5 ML SYRINGE
INTRAMUSCULAR | Status: AC
  Filled 2018-01-27: qty 5

## 2018-01-27 MED ORDER — ONABOTULINUMTOXINA 100 UNITS IJ SOLR
INTRAMUSCULAR | Status: DC | PRN
  Administered 2018-01-27: 100 [IU] via INTRAMUSCULAR

## 2018-01-27 MED ORDER — FENTANYL CITRATE (PF) 100 MCG/2ML IJ SOLN
INTRAMUSCULAR | Status: DC | PRN
  Administered 2018-01-27 (×2): 25 ug via INTRAVENOUS
  Administered 2018-01-27: 50 ug via INTRAVENOUS

## 2018-01-27 SURGICAL SUPPLY — 59 items
BENZOIN TINCTURE PRP APPL 2/3 (GAUZE/BANDAGES/DRESSINGS) ×4 IMPLANT
BLADE EXTENDED COATED 6.5IN (ELECTRODE) IMPLANT
BLADE HEX COATED 2.75 (ELECTRODE) ×2 IMPLANT
BLADE SURG 10 STRL SS (BLADE) IMPLANT
BLADE SURG 15 STRL LF DISP TIS (BLADE) ×1 IMPLANT
BLADE SURG 15 STRL SS (BLADE) ×1
BRIEF STRETCH FOR OB PAD LRG (UNDERPADS AND DIAPERS) ×4 IMPLANT
CANISTER SUCT 3000ML PPV (MISCELLANEOUS) ×2 IMPLANT
COVER BACK TABLE 60X90IN (DRAPES) ×2 IMPLANT
COVER MAYO STAND STRL (DRAPES) ×2 IMPLANT
COVER WAND RF STERILE (DRAPES) ×4 IMPLANT
DECANTER SPIKE VIAL GLASS SM (MISCELLANEOUS) ×2 IMPLANT
DRAPE LAPAROTOMY 100X72 PEDS (DRAPES) ×2 IMPLANT
DRAPE UTILITY XL STRL (DRAPES) ×2 IMPLANT
DRSG PAD ABDOMINAL 8X10 ST (GAUZE/BANDAGES/DRESSINGS) ×2 IMPLANT
ELECT REM PT RETURN 9FT ADLT (ELECTROSURGICAL) ×2
ELECTRODE REM PT RTRN 9FT ADLT (ELECTROSURGICAL) ×1 IMPLANT
GAUZE SPONGE 4X4 12PLY STRL (GAUZE/BANDAGES/DRESSINGS) ×2 IMPLANT
GLOVE BIO SURGEON STRL SZ 6.5 (GLOVE) ×2 IMPLANT
GLOVE BIOGEL PI IND STRL 7.0 (GLOVE) ×1 IMPLANT
GLOVE BIOGEL PI INDICATOR 7.0 (GLOVE) ×1
GOWN SPEC L3 XXLG W/TWL (GOWN DISPOSABLE) ×2 IMPLANT
GOWN STRL REUS W/TWL 2XL LVL3 (GOWN DISPOSABLE) ×2 IMPLANT
HYDROGEN PEROXIDE 16OZ (MISCELLANEOUS) ×2 IMPLANT
IV CATH 14GX2 1/4 (CATHETERS) ×2 IMPLANT
IV CATH 18G SAFETY (IV SOLUTION) ×2 IMPLANT
KIT TURNOVER CYSTO (KITS) ×2 IMPLANT
LOOP VESSEL MAXI BLUE (MISCELLANEOUS) IMPLANT
NDL SAFETY ECLIPSE 18X1.5 (NEEDLE) IMPLANT
NEEDLE HYPO 18GX1.5 SHARP (NEEDLE)
NEEDLE HYPO 22GX1.5 SAFETY (NEEDLE) ×2 IMPLANT
NS IRRIG 500ML POUR BTL (IV SOLUTION) ×2 IMPLANT
PACK BASIN DAY SURGERY FS (CUSTOM PROCEDURE TRAY) ×2 IMPLANT
PAD ABD 8X10 STRL (GAUZE/BANDAGES/DRESSINGS) ×2 IMPLANT
PAD ARMBOARD 7.5X6 YLW CONV (MISCELLANEOUS) IMPLANT
PENCIL BUTTON HOLSTER BLD 10FT (ELECTRODE) ×2 IMPLANT
SPONGE HEMORRHOID 8X3CM (HEMOSTASIS) IMPLANT
SPONGE SURGIFOAM ABS GEL 100 (HEMOSTASIS) IMPLANT
SPONGE SURGIFOAM ABS GEL 12-7 (HEMOSTASIS) IMPLANT
SUCTION FRAZIER HANDLE 10FR (MISCELLANEOUS)
SUCTION TUBE FRAZIER 10FR DISP (MISCELLANEOUS) IMPLANT
SUT CHROMIC 2 0 SH (SUTURE) IMPLANT
SUT CHROMIC 3 0 SH 27 (SUTURE) IMPLANT
SUT ETHIBOND 0 (SUTURE) IMPLANT
SUT VIC AB 2-0 SH 27 (SUTURE) ×3
SUT VIC AB 2-0 SH 27XBRD (SUTURE) ×3 IMPLANT
SUT VIC AB 3-0 SH 18 (SUTURE) IMPLANT
SUT VIC AB 3-0 SH 27 (SUTURE)
SUT VIC AB 3-0 SH 27XBRD (SUTURE) IMPLANT
SUT VIC AB 4-0 P-3 18XBRD (SUTURE) IMPLANT
SUT VIC AB 4-0 P3 18 (SUTURE)
SUT VIC AB 4-0 SH 18 (SUTURE) IMPLANT
SYR CONTROL 10ML LL (SYRINGE) ×2 IMPLANT
TOWEL OR 17X24 6PK STRL BLUE (TOWEL DISPOSABLE) ×2 IMPLANT
TRAY DSU PREP LF (CUSTOM PROCEDURE TRAY) ×2 IMPLANT
TUBE CONNECTING 12X1/4 (SUCTIONS) ×2 IMPLANT
UNDERPAD 30X30 (UNDERPADS AND DIAPERS) ×2 IMPLANT
WATER STERILE IRR 500ML POUR (IV SOLUTION) ×2 IMPLANT
YANKAUER SUCT BULB TIP NO VENT (SUCTIONS) ×2 IMPLANT

## 2018-01-27 NOTE — Op Note (Signed)
01/27/2018  11:43 AM  PATIENT:  Anthony Morales  32 y.o. male  Patient Care Team: Patient, No Pcp Per as PCP - General ([place]) Fields, Marga Melnick, MD (Gastroenterology)  PRE-OPERATIVE DIAGNOSIS:  Anal pain  POST-OPERATIVE DIAGNOSIS:  Grade 3 hemorrhoids/anal fissure  PROCEDURE:   ANAL EXAM UNDER ANESTHESIA HEMORHOIDOPEXY CHEMICAL SPHINCTEROTOMY (BOTOX)    Surgeon(s): Leighton Ruff, MD  ASSISTANT: none   ANESTHESIA:   local and MAC  SPECIMEN:  No Specimen  DISPOSITION OF SPECIMEN:  N/A  COUNTS:  YES  PLAN OF CARE: Discharge to home after PACU  PATIENT DISPOSITION:  PACU - hemodynamically stable.  INDICATION: 32 y.o. M with anal pain and bleeding   OR FINDINGS: Grade 3 left lateral internal hemorrhoid, grade 2 right anterior and posterior internal hemorrhoids all with signs of inflammation and bleeding.  Posterior midline anal fissure  DESCRIPTION: the patient was identified in the preoperative holding area and taken to the OR where they were laid on the operating room table.  MAC anesthesia was induced without difficulty. The patient was then positioned in prone jackknife position with buttocks gently taped apart.  The patient was then prepped and draped in usual sterile fashion.  SCDs were noted to be in place prior to the initiation of anesthesia. A surgical timeout was performed indicating the correct patient, procedure, positioning and need for preoperative antibiotics.  A rectal block was performed using Marcaine with epinephrine mixed with Experel.    I began with a digital rectal exam.  The patient had some mild to moderate anal sphincter hypertension.  I then placed a Hill-Ferguson anoscope into the anal canal and evaluated this completely.  Patient was noted to have a grade 3 left lateral internal hemorrhoid with active inflammation.  There was also a grade 2 right anterior and right posterior internal hemorrhoids with active inflammation.  There was a  posterior midline anal fissure which was partially healed.  I decided to perform a hemorrhoidal pexy of all 3 internal hemorrhoids.  This was done with 3-0 Vicryl sutures.  Once this was completed, I injected 100 units of Botox into the intersphincteric space to allow for the chemical sphincterotomy for his anal fissure.  After this a sterile dressing was applied.  Lidocaine ointment was also applied.  The patient was then awakened from anesthesia and sent to the postanesthesia care unit in stable condition.  All counts were correct per operating room staff.  I have reviewed the Gu-Win for this patient.  There are no other active prescriptions noted for this patient.

## 2018-01-27 NOTE — Anesthesia Procedure Notes (Signed)
Procedure Name: MAC Date/Time: 01/27/2018 10:55 AM Performed by: Albertha Ghee, MD Pre-anesthesia Checklist: Patient identified, Emergency Drugs available, Suction available, Patient being monitored and Timeout performed Oxygen Delivery Method: Nasal cannula Placement Confirmation: positive ETCO2,  CO2 detector and breath sounds checked- equal and bilateral

## 2018-01-27 NOTE — Transfer of Care (Signed)
  Last Vitals:  Vitals Value Taken Time  BP 114/93 01/27/2018 11:45 AM  Temp    Pulse 96 01/27/2018 11:52 AM  Resp 21 01/27/2018 11:52 AM  SpO2 100 % 01/27/2018 11:52 AM  Vitals shown include unvalidated device data.  Last Pain:  Vitals:   01/27/18 2671  TempSrc: Oral         Immediate Anesthesia Transfer of Care Note  Patient: Anthony Morales  Procedure(s) Performed: Procedure(s) (LRB): ANAL EXAM UNDER ANESTHESIA (N/A) HEMORHOIDOPEXY (N/A) CHEMICAL SPHINCTEROTOMY (BOTOX) (N/A)  Patient Location: PACU  Anesthesia Type:MAC  Level of Consciousness: awake, alert  and oriented  Airway & Oxygen Therapy: Patient Spontanous Breathing and Patient connected to face mask oxygen  Post-op Assessment: Report given to PACU RN and Post -op Vital signs reviewed and stable  Post vital signs: Reviewed and stable  Complications: No apparent anesthesia complications

## 2018-01-27 NOTE — Interval H&P Note (Signed)
History and Physical Interval Note:  01/27/2018 9:51 AM  Anthony Morales  has presented today for surgery, with the diagnosis of Grade 3 hemorrhoids/fissure  The various methods of treatment have been discussed with the patient and family. After consideration of risks, benefits and other options for treatment, the patient has consented to  Procedure(s): ANAL EXAM UNDER ANESTHESIA (N/A) POSSIBLE HEMORRHOIDECTOMY VS HEMORHOIDOPEXY (N/A) POSSIBLE CHEMICAL SPHINCTEROTOMY (BOTOX) (N/A) as a surgical intervention .  The patient's history has been reviewed, patient examined, no change in status, stable for surgery.  I have reviewed the patient's chart and labs.  Questions were answered to the patient's satisfaction.     Vanita PandaAlicia C Tezra Mahr, MD  Colorectal and General Surgery [facility]

## 2018-01-27 NOTE — Anesthesia Preprocedure Evaluation (Signed)
Anesthesia Evaluation  Patient identified by MRN, date of birth, ID band Patient awake    Reviewed: Allergy & Precautions, H&P , NPO status , Patient's Chart, lab work & pertinent test results  Airway Mallampati: II   Neck ROM: full    Dental   Pulmonary Current Smoker,    breath sounds clear to auscultation       Cardiovascular negative cardio ROS   Rhythm:regular Rate:Normal     Neuro/Psych PSYCHIATRIC DISORDERS Anxiety Depression    GI/Hepatic GERD  ,(+)     substance abuse  alcohol use and marijuana use, Anal fissure   Endo/Other    Renal/GU      Musculoskeletal   Abdominal   Peds  Hematology   Anesthesia Other Findings   Reproductive/Obstetrics                             Anesthesia Physical Anesthesia Plan  ASA: II  Anesthesia Plan: MAC   Post-op Pain Management:    Induction: Intravenous  PONV Risk Score and Plan: 0 and Propofol infusion, Ondansetron and Treatment may vary due to age or medical condition  Airway Management Planned: Simple Face Mask  Additional Equipment:   Intra-op Plan:   Post-operative Plan:   Informed Consent: I have reviewed the patients History and Physical, chart, labs and discussed the procedure including the risks, benefits and alternatives for the proposed anesthesia with the patient or authorized representative who has indicated his/her understanding and acceptance.     Plan Discussed with: CRNA, Anesthesiologist and Surgeon  Anesthesia Plan Comments:         Anesthesia Quick Evaluation

## 2018-01-27 NOTE — Progress Notes (Signed)
Dr. Farrel GobbleAliciaThomas called at patient's request to ask if patient could have tablet form of oxycodone.  No change since they discussed patient needing liquid medication prior to surgery, and since absorption will be quicker than tablet.

## 2018-01-27 NOTE — Anesthesia Postprocedure Evaluation (Signed)
Anesthesia Post Note  Patient: Anthony Morales  Procedure(s) Performed: ANAL EXAM UNDER ANESTHESIA (N/A ) HEMORHOIDOPEXY (N/A ) CHEMICAL SPHINCTEROTOMY (BOTOX) (N/A )     Patient location during evaluation: PACU Anesthesia Type: MAC Level of consciousness: awake and alert Pain management: pain level controlled Vital Signs Assessment: post-procedure vital signs reviewed and stable Respiratory status: spontaneous breathing, nonlabored ventilation, respiratory function stable and patient connected to nasal cannula oxygen Cardiovascular status: stable and blood pressure returned to baseline Postop Assessment: no apparent nausea or vomiting Anesthetic complications: no    Last Vitals:  Vitals:   01/27/18 1230 01/27/18 1350  BP: 109/64 121/71  Pulse: 73 85  Resp: (!) 9 16  Temp:  37.5 C  SpO2: 97% 100%    Last Pain:  Vitals:   01/27/18 1402  TempSrc:   PainSc: [place]

## 2018-01-27 NOTE — Discharge Instructions (Addendum)
ANORECTAL SURGERY: POST OP INSTRUCTIONS °1. Take your usually prescribed home medications unless otherwise directed. °2. DIET: During the first few hours after surgery sip on some liquids until you are able to urinate.  It is normal to not urinate for several hours after this surgery.  If you feel uncomfortable, please contact the office for instructions.  After you are able to urinate,you may eat, if you feel like it.  Follow a light bland diet the first 24 hours after arrival home, such as soup, liquids, crackers, etc.  Be sure to include lots of fluids daily (6-8 glasses).  Avoid fast food or heavy meals, as your are more likely to get nauseated.  Eat a low fat diet the next few days after surgery.  Limit caffeine intake to 1-2 servings a day. °3. PAIN CONTROL: °a. Pain is best controlled by a usual combination of several different methods TOGETHER: °i. Muscle relaxation: Soak in a warm bath (or Sitz bath) three times a day and after bowel movements.  Continue to do this until all pain is resolved. °ii. Over the counter pain medication °iii. Prescription pain medication °b. Most patients will experience some swelling and discomfort in the anus/rectal area and incisions.  Heat such as warm towels, sitz baths, warm baths, etc to help relax tight/sore spots and speed recovery.  Some people prefer to use ice, especially in the first couple days after surgery, as it may decrease the pain and swelling, or alternate between ice & heat.  Experiment to what works for you.  Swelling and bruising can take several weeks to resolve.  Pain can take even longer to completely resolve. °c. It is helpful to take an over-the-counter pain medication regularly for the first few weeks.  Choose one of the following that works best for you: °i. Naproxen (Aleve, etc)  Two 220mg tabs twice a day °ii. Ibuprofen (Advil, etc) Three 200mg tabs four times a day (every meal & bedtime) °d. A  prescription for pain medication (such as percocet,  oxycodone, hydrocodone, etc) should be given to you upon discharge.  Take your pain medication as prescribed.  °i. If you are having problems/concerns with the prescription medicine (does not control pain, nausea, vomiting, rash, itching, etc), please call us [phone] to see if we need to switch you to a different pain medicine that will work better for you and/or control your side effect better. °ii. If you need a refill on your pain medication, please contact your pharmacy.  They will contact our office to request authorization. Prescriptions will not be filled after 5 pm or on week-ends. °4. KEEP YOUR BOWELS REGULAR and AVOID CONSTIPATION °a. The goal is one to two soft bowel movements a day.  You should at least have a bowel movement every other day. °b. Avoid getting constipated.  Between the surgery and the pain medications, it is common to experience some constipation. This can be very painful after rectal surgery.  Increasing fluid intake and taking a fiber supplement (such as Metamucil, Citrucel, FiberCon, etc) 1-2 times a day regularly will usually help prevent this problem from occurring.  A stool softener like colace is also recommended.  This can be purchased over the counter at your pharmacy.  You can take it up to 3 times a day.  If you do not have a bowel movement after 24 hrs since your surgery, take one does of milk of magnesia.  If you still haven't had a bowel movement 8-12 hours after   that dose, take another dose.  If you don't have a bowel movement 48 hrs after surgery, purchase a Fleets enema from the drug store and administer gently per package instructions.  If you still are having trouble with your bowel movements after that, please call the office for further instructions. °c. If you develop diarrhea or have many loose bowel movements, simplify your diet to bland foods & liquids for a few days.  Stop any stool softeners and decrease your fiber supplement.  Switching to mild  anti-diarrheal medications (Kayopectate, Pepto Bismol) can help.  If this worsens or does not improve, please call us. ° °5. Wound Care °a. Remove your bandages before your first bowel movement or 8 hours after surgery.     °b. Remove any wound packing material at this tim,e as well.  You do not need to repack the wound unless instructed otherwise.  Wear an absorbent pad or soft cotton gauze in your underwear to catch any drainage and help keep the area clean. You should change this every 2-3 hours while awake. °c. Keep the area clean and dry.  Bathe / shower every day, especially after bowel movements.  Keep the area clean by showering / bathing over the incision / wound.   It is okay to soak an open wound to help wash it.  Wet wipes or showers / gentle washing after bowel movements is often less traumatic than regular toilet paper. °d. You may have some styrofoam-like soft packing in the rectum which will come out with the first bowel movement.  °e. You will often notice bleeding with bowel movements.  This should slow down by the end of the first week of surgery °f. Expect some drainage.  This should slow down, too, by the end of the first week of surgery.  Wear an absorbent pad or soft cotton gauze in your underwear until the drainage stops. °g. Do Not sit on a rubber or pillow ring.  This can make you symptoms worse.  You may sit on a soft pillow if needed.  °6. ACTIVITIES as tolerated:   °a. You may resume regular (light) daily activities beginning the next day--such as daily self-care, walking, climbing stairs--gradually increasing activities as tolerated.  If you can walk 30 minutes without difficulty, it is safe to try more intense activity such as jogging, treadmill, bicycling, low-impact aerobics, swimming, etc. °b. Save the most intensive and strenuous activity for last such as sit-ups, heavy lifting, contact sports, etc  Refrain from any heavy lifting or straining until you are off narcotics for pain  control.   °c. You may drive when you are no longer taking prescription pain medication, you can comfortably sit for long periods of time, and you can safely maneuver your car and apply brakes. °d. You may have sexual intercourse when it is comfortable.  °7. FOLLOW UP in our office °a. Please call [facility] at [phone] to set up an appointment to see your surgeon in the office for a follow-up appointment approximately 3-4 weeks after your surgery. °b. Make sure that you call for this appointment the day you arrive home to insure a convenient appointment time. °10. IF YOU HAVE DISABILITY OR FAMILY LEAVE FORMS, BRING THEM TO THE OFFICE FOR PROCESSING.  DO NOT GIVE THEM TO YOUR DOCTOR. ° ° ° ° °WHEN TO CALL US [phone]: °1. Poor pain control °2. Reactions / problems with new medications (rash/itching, nausea, etc)  °3. Fever over 101.5 F (38.5 C) °4.   Inability to urinate 5. Nausea and/or vomiting 6. Worsening swelling or bruising 7. Continued bleeding from incision. 8. Increased pain, redness, or drainage from the incision  The clinic staff is available to answer your questions during regular business hours (8:30am-5pm).  Please dont hesitate to call and ask to speak to one of our nurses for clinical concerns.   [facility] is always on call at the hospitals   If you have a medical emergency, go to the nearest emergency room or call 911.    [facility] [address], [address], [place], [place]  [id number] ? MAIN: [phone] ? TOLL FREE: [phone] ? FAX [phone] [email or web address]   If you do not have enough pain medication to last through the weekend, call the pharmacy no later than Friday morning.     Post Anesthesia Home Care Instructions  Activity: Get plenty of rest for the remainder of the day. A responsible individual must stay with you for 24 hours following the procedure.  For the next 24 hours, DO  NOT: -Drive a car -Advertising copywriterperate machinery -Drink alcoholic beverages -Take any medication unless instructed by your physician -Make any legal decisions or sign important papers.  Meals: Start with liquid foods such as gelatin or soup. Progress to regular foods as tolerated. Avoid greasy, spicy, heavy foods. If nausea and/or vomiting occur, drink only clear liquids until the nausea and/or vomiting subsides. Call your physician if vomiting continues.  Special Instructions/Symptoms: Your throat may feel dry or sore from the anesthesia or the breathing tube placed in your throat during surgery. If this causes discomfort, gargle with warm salt water. The discomfort should disappear within 24 hours.

## 2018-01-28 ENCOUNTER — Encounter (HOSPITAL_BASED_OUTPATIENT_CLINIC_OR_DEPARTMENT_OTHER): Payer: Self-pay | Admitting: General Surgery

## 2018-03-07 DIAGNOSIS — R1033 Periumbilical pain: Secondary | ICD-10-CM | POA: Diagnosis not present

## 2018-03-07 DIAGNOSIS — A084 Viral intestinal infection, unspecified: Secondary | ICD-10-CM | POA: Diagnosis not present

## 2018-03-07 DIAGNOSIS — R109 Unspecified abdominal pain: Secondary | ICD-10-CM | POA: Diagnosis not present

## 2018-03-07 DIAGNOSIS — F17200 Nicotine dependence, unspecified, uncomplicated: Secondary | ICD-10-CM | POA: Diagnosis not present

## 2018-03-07 DIAGNOSIS — K529 Noninfective gastroenteritis and colitis, unspecified: Secondary | ICD-10-CM | POA: Diagnosis not present

## 2018-12-20 DIAGNOSIS — K6289 Other specified diseases of anus and rectum: Secondary | ICD-10-CM | POA: Diagnosis not present

## 2019-10-06 ENCOUNTER — Ambulatory Visit (INDEPENDENT_AMBULATORY_CARE_PROVIDER_SITE_OTHER): Payer: 59

## 2019-10-06 ENCOUNTER — Encounter: Payer: Self-pay | Admitting: Orthopaedic Surgery

## 2019-10-06 ENCOUNTER — Other Ambulatory Visit: Payer: Self-pay

## 2019-10-06 ENCOUNTER — Ambulatory Visit (INDEPENDENT_AMBULATORY_CARE_PROVIDER_SITE_OTHER): Payer: 59 | Admitting: Orthopaedic Surgery

## 2019-10-06 VITALS — BP 110/75 | HR 89 | Ht 70.0 in | Wt 115.0 lb

## 2019-10-06 DIAGNOSIS — M549 Dorsalgia, unspecified: Secondary | ICD-10-CM | POA: Diagnosis not present

## 2019-10-10 NOTE — Progress Notes (Signed)
Office Visit Note   Patient: Anthony Morales           Date of Birth: September 10, 1985           MRN: [id number] Visit Date: 10/06/2019              Requested by: No referring provider defined for this encounter. PCP: Patient, No Pcp Per   Assessment & Plan: Visit Diagnoses:  1. Mid back pain     Plan: Normal activity recommended.  We discussed using good lifting techniques.  Walking stretching program discussed.  He can follow-up if he develops increased symptoms.  Follow-Up Instructions: Return if symptoms worsen or fail to improve.   Orders:  Orders Placed This Encounter  Procedures  . XR Thoracic Spine 2 View   No orders of the defined types were placed in this encounter.     Procedures: No procedures performed   Clinical Data: No additional findings.   Subjective: Chief Complaint  Patient presents with  . Middle Back - Pain    HPI alcohol abuse in remission positive for anxiety.  Recent problems with hemorrhoids.  34 year old male with pain in his back primarily on the right side does not radiate to his legs no bowel or bladder symptoms.  States sometimes when he lifts heavy objects he has some increased discomfort.  He feels like he has some bruising in his ribs.  He is used [employer], children's ibuprofen without relief.  19 a chiropractor for 2 months was told he had some curvature has been through treatment and states it really has not gotten any better.  He has difficulty swallowing pills.  He does have history of  Review of Systems 14 point systems noncontributory other than as mentioned in HPI.   Objective: Vital Signs: BP 110/75   Pulse 89   Ht 5\' 10"  (1.778 m)   Wt 115 lb (52.2 kg)   BMI 16.50 kg/m   Physical Exam Constitutional:      Appearance: He is well-developed.  HENT:     Head: Normocephalic and atraumatic.  Eyes:     Pupils: Pupils are equal, round, and reactive to light.  Neck:     Thyroid: No thyromegaly.     Trachea: No  tracheal deviation.  Cardiovascular:     Rate and Rhythm: Normal rate.  Pulmonary:     Effort: Pulmonary effort is normal.     Breath sounds: No wheezing.  Abdominal:     General: Bowel sounds are normal.     Palpations: Abdomen is soft.  Skin:    General: Skin is warm and dry.     Capillary Refill: Capillary refill takes less than 2 seconds.  Neurological:     Mental Status: He is alert and oriented to person, place, and time.  Psychiatric:        Behavior: Behavior normal.        Thought Content: Thought content normal.        Judgment: Judgment normal.     Ortho Exam patient has trace right thoracic curvature no scapular asymmetry reflexes are 2+ normal heel and toe walking good forward flexion extension lateral bending.  Pectus is normal. Specialty Comments:  No specialty comments available.  Imaging: AP lateral thoracic x-rays are obtained and reviewed.  This shows normal portion of lung field visualized.  No compression fractures no disc base narrowing.  There is a slight right thoracic curve less than 10 degrees.  Impression: Trace right thoracic  curve.  Negative for acute or chronic changes otherwise.   PMFS History: Patient Active Problem List   Diagnosis Date Noted  . Internal prolapsed hemorrhoids 10/06/2017  . Anal fissure 10/06/2017  . ADD (attention deficit disorder with hyperactivity) 11/11/2010  . Anxiety 11/11/2010  . Marijuana abuse 11/11/2010  . Alcohol abuse, in remission 11/11/2010  . Abdominal pain 06/24/2010  . Hematochezia 06/24/2010  . External hemorrhoid, thrombosed 06/24/2010   Past Medical History:  Diagnosis Date  . Acid reflux    occ  . ADD (attention deficit disorder with hyperactivity)   . Alcoholism (HCC)    quit 2012, no hx of liver problems per patient  . Anal fissure   . Anxiety   . Depression   . External hemorrhoid, thrombosed   . Rectal bleeding   . Substance abuse (HCC)     Family History  Problem Relation Age of  Onset  . Alcohol abuse Mother   . Depression Mother   . Hypertension Father   . Clotting disorder Maternal Grandmother   . Colon cancer Neg Hx   . Esophageal cancer Neg Hx   . Stomach cancer Neg Hx     Past Surgical History:  Procedure Laterality Date  . colonscopy    . HEMORRHOID SURGERY     banding x 2 2016 and hemorrhoid surgery 2012  . HEMORRHOID SURGERY N/A 01/27/2018   Procedure: HEMORHOIDOPEXY;  Surgeon: Romie Levee, MD;  [facility];  Service: General;  Laterality: N/A;  . RECTAL EXAM UNDER ANESTHESIA N/A 01/27/2018   Procedure: ANAL EXAM UNDER ANESTHESIA;  Surgeon: Romie Levee, MD;  [facility];  Service: General;  Laterality: N/A;  . SPHINCTEROTOMY N/A 01/27/2018   Procedure: CHEMICAL SPHINCTEROTOMY (BOTOX);  Surgeon: Romie Levee, MD;  Location: [facility];  Service: General;  Laterality: N/A;  . WISDOM TOOTH EXTRACTION     Social History   Occupational History  . Occupation: Unemployed  Tobacco Use  . Smoking status: Current Every Day Smoker    Packs/day: 1.00    Years: 16.00    Pack years: 16.00    Types: Cigarettes  . Smokeless tobacco: Never Used  . Tobacco comment: 1/2 ppd now 01-19-18  Vaping Use  . Vaping Use: Never used  Substance and Sexual Activity  . Alcohol use: No    Comment: jan 15th 2012 quit drinking  . Drug use: Yes    Frequency: 14.0 times per week    Types: Marijuana    Comment: several times per day  . Sexual activity: Yes    Partners: Female    Birth control/protection: Pill    Comment: spouse

## 2019-11-16 ENCOUNTER — Ambulatory Visit: Payer: 59 | Admitting: Urology

## 2019-11-30 ENCOUNTER — Other Ambulatory Visit: Payer: Self-pay

## 2019-11-30 ENCOUNTER — Telehealth: Payer: Self-pay | Admitting: Urology

## 2019-11-30 ENCOUNTER — Encounter: Payer: Self-pay | Admitting: Urology

## 2019-11-30 ENCOUNTER — Ambulatory Visit (INDEPENDENT_AMBULATORY_CARE_PROVIDER_SITE_OTHER): Payer: 59 | Admitting: Urology

## 2019-11-30 VITALS — BP 133/80 | HR 99 | Temp 98.7°F | Ht 70.0 in | Wt 114.0 lb

## 2019-11-30 DIAGNOSIS — R1032 Left lower quadrant pain: Secondary | ICD-10-CM | POA: Diagnosis not present

## 2019-11-30 DIAGNOSIS — N50819 Testicular pain, unspecified: Secondary | ICD-10-CM | POA: Diagnosis not present

## 2019-11-30 LAB — MICROSCOPIC EXAMINATION
Bacteria, UA: NONE SEEN
Epithelial Cells (non renal): NONE SEEN /hpf (ref 0–10)
Renal Epithel, UA: NONE SEEN /hpf
WBC, UA: NONE SEEN /hpf (ref 0–5)

## 2019-11-30 LAB — URINALYSIS, ROUTINE W REFLEX MICROSCOPIC
Bilirubin, UA: NEGATIVE
Glucose, UA: NEGATIVE
Ketones, UA: NEGATIVE
Leukocytes,UA: NEGATIVE
Nitrite, UA: NEGATIVE
Protein,UA: NEGATIVE
Specific Gravity, UA: <1.005 — ABNORMAL LOW (ref 1.005–1.030)
Urobilinogen, Ur: 0.2 mg/dL (ref 0.2–1.0)
pH, UA: 6.5 (ref 5.0–7.5)

## 2019-11-30 MED ORDER — CYCLOBENZAPRINE HCL 5 MG PO TABS: 5.0000 mg | ORAL_TABLET | Freq: Three times a day (TID) | ORAL | 0 refills | Status: DC | PRN

## 2019-11-30 NOTE — Progress Notes (Signed)
11/30/2019 10:32 AM   Anthony Morales Dec 08, 1985 [id number]  Referring provider: No referring provider defined for this encounter.  Left testicular pain  HPI: Anthony Morales is a 34yo here for evaluation of left testicular pain. The pain started around 7/24. No increased swelling in his scrotum. The pain starts in his left flank and then radiates to his left testis. He denies any LUTS. NO hx of nephrolithiasis but he was evaluated at [facility] for nephrolithiasis several years ago. The pain sharp, intermittent and radiates to his flank. At times the pain feels similar to a muscle cramp   PMH: Past Medical History:  Diagnosis Date  . Acid reflux    occ  . ADD (attention deficit disorder with hyperactivity)   . Alcoholism (HCC)    quit 2012, no hx of liver problems per patient  . Anal fissure   . Anxiety   . Depression   . External hemorrhoid, thrombosed   . Rectal bleeding   . Substance abuse [facility])     Surgical History: Past Surgical History:  Procedure Laterality Date  . colonscopy    . HEMORRHOID SURGERY     banding x 2 2016 and hemorrhoid surgery 2012  . HEMORRHOID SURGERY N/A 01/27/2018   Procedure: HEMORHOIDOPEXY;  Surgeon: Romie Levee, MD;  [facility];  Service: General;  Laterality: N/A;  . RECTAL EXAM UNDER ANESTHESIA N/A 01/27/2018   Procedure: ANAL EXAM UNDER ANESTHESIA;  Surgeon: Romie Levee, MD;  Location[facility];  Service: General;  Laterality: N/A;  . SPHINCTEROTOMY N/A 01/27/2018   Procedure: CHEMICAL SPHINCTEROTOMY (BOTOX);  Surgeon: Romie Levee, MD;  Location[facility];  Service: General;  Laterality: N/A;  . WISDOM TOOTH EXTRACTION      Home Medications:  Allergies as of 11/30/2019      Reactions   Doxycycline Diarrhea   Immediately had diarrhea after taking the pill (chewed with food) Also felt like stomach was on fire      Medication List       Accurate as of November 30, 2019 10:32 AM. If you have any questions, ask your nurse or doctor.        acetaminophen 500 MG tablet Commonly known as: TYLENOL Take 1,000 mg by mouth every 6 (six) hours as needed. Will take 1 or 2   ALPRAZolam 0.5 MG tablet Commonly known as: XANAX Take 1 mg by mouth as needed.   AMBULATORY NON FORMULARY MEDICATION Medication Name: Nitroglycerine ointment 0.125 %  Apply a pea sized amount internally two times daily for 8 to 10 weeks. Dispense 30 GM zero refill   amphetamine-dextroamphetamine 20 MG 24 hr capsule Commonly known as: ADDERALL XR Take 20 mg by mouth every morning.   chlorhexidine 0.12 % solution Commonly known as: PERIDEX SMARTSIG:By Mouth   Fiber Select Gummies Chew Chew by mouth. 2 tabs per day 2.5 mg each   GOODY HEADACHE PO Take 1 Dose by mouth as needed.   IBUPROFEN 100 JUNIOR STRENGTH PO Take by mouth. Equate ibuprofen as needed every 6 hours   sertraline 25 MG tablet Commonly known as: ZOLOFT Take 25 mg by mouth daily.       Allergies:  Allergies  Allergen Reactions  . Doxycycline Diarrhea    Immediately had diarrhea after taking the pill (chewed with food) Also felt like stomach was on fire    Family History: Family History  Problem Relation Age of Onset  . Alcohol abuse Mother   .  Depression Mother   . Hypertension Father   . Clotting disorder Maternal Grandmother   . Colon cancer Neg Hx   . Esophageal cancer Neg Hx   . Stomach cancer Neg Hx     Social History:  reports that he has been smoking cigarettes. He has a 16.00 pack-year smoking history. He has never used smokeless tobacco. He reports current drug use. Frequency: 14.00 times per week. Drug: Marijuana. He reports that he does not drink alcohol.  ROS: All other review of systems were reviewed and are negative except what is noted above in HPI  Physical Exam: BP 133/80   Pulse 99   Temp 98.7 F (37.1 C)   Ht 5\' 10"  (1.778 m)   Wt 114 lb (51.7 kg)   BMI 16.36 kg/m    Constitutional:  Alert and oriented, No acute distress. HEENT: [place] AT, moist mucus membranes.  Trachea midline, no masses. Cardiovascular: No clubbing, cyanosis, or edema. Respiratory: Normal respiratory effort, no increased work of breathing. GI: Abdomen is soft, nontender, nondistended, no abdominal masses GU: No CVA tenderness. Circumcised phallus. No masses/lesions on penis, testis, scrotum.  Lymph: No cervical or inguinal lymphadenopathy. Skin: No rashes, bruises or suspicious lesions. Neurologic: Grossly intact, no focal deficits, moving all 4 extremities. Psychiatric: Normal mood and affect.  Laboratory Data: Lab Results  Component Value Date   WBC 8.1 11/11/2010   HGB 15.5 11/11/2010   HCT 45.8 11/11/2010   MCV 95.1 11/11/2010   PLT 199.0 11/11/2010    Lab Results  Component Value Date   CREATININE 0.8 11/11/2010    No results found for: PSA  No results found for: TESTOSTERONE  No results found for: HGBA1C  Urinalysis    Component Value Date/Time   COLORURINE YELLOW 05/04/2009 2057   APPEARANCEUR CLEAR 05/04/2009 2057   LABSPEC 1.010 05/04/2009 2057   PHURINE 5.5 05/04/2009 2057   GLUCOSEU NEGATIVE 05/04/2009 2057   HGBUR TRACE (A) 05/04/2009 2057   BILIRUBINUR NEGATIVE 05/04/2009 2057   KETONESUR NEGATIVE 05/04/2009 2057   PROTEINUR NEGATIVE 05/04/2009 2057   UROBILINOGEN 0.2 05/04/2009 2057   NITRITE NEGATIVE 05/04/2009 2057   LEUKOCYTESUR NEGATIVE 05/04/2009 2057    Lab Results  Component Value Date   BACTERIA RARE 01/28/2009    Pertinent Imaging:  No results found for this or any previous visit.  No results found for this or any previous visit.  No results found for this or any previous visit.  No results found for this or any previous visit.  No results found for this or any previous visit.  No results found for this or any previous visit.  No results found for this or any previous visit.  No results found for this or any previous  visit.   Assessment & Plan:    1. Pain in testicle, unspecified laterality -We will obtain Ct stone study -Rx for flexeril for dorsalgia - Urinalysis, Routine w reflex microscopic   No follow-ups on file.  14/06/2008, MD  [facility] [place]

## 2019-11-30 NOTE — Patient Instructions (Signed)

## 2019-11-30 NOTE — Telephone Encounter (Signed)
Pt informed to alternate tylenol and IBU and try sitting in a warm bath. Pt voiced understanding.

## 2019-11-30 NOTE — Progress Notes (Signed)
Urological Symptom Review  Patient is experiencing the following symptoms: None    Review of Systems  Gastrointestinal (upper)  : Negative for upper GI symptoms  Gastrointestinal (lower) : Negative for lower GI symptoms  Constitutional : Fatigue  Skin: Negative for skin symptoms  Eyes: Negative for eye symptoms  Ear/Nose/Throat : Negative for Ear/Nose/Throat symptoms  Hematologic/Lymphatic: Negative for Hematologic/Lymphatic symptoms  Cardiovascular : Negative for cardiovascular symptoms  Respiratory : Negative for respiratory symptoms  Endocrine: Negative for endocrine symptoms  Musculoskeletal: Back pain   Neurological: Headaches  Psychologic: Anxiety

## 2019-11-30 NOTE — Telephone Encounter (Signed)
Patient called and states that he has taken a muscle relaxer after his appt this morning and has not felt any relief. He asked if anything stronger can be sent in.

## 2019-12-13 ENCOUNTER — Ambulatory Visit (HOSPITAL_COMMUNITY)
Admission: RE | Admit: 2019-12-13 | Discharge: 2019-12-13 | Disposition: A | Discharge status: Home / Self Care | Payer: 59 | Source: Ambulatory Visit | Attending: Urology | Admitting: Urology

## 2019-12-13 ENCOUNTER — Other Ambulatory Visit: Payer: Self-pay

## 2019-12-13 DIAGNOSIS — R1032 Left lower quadrant pain: Secondary | ICD-10-CM | POA: Insufficient documentation

## 2019-12-13 DIAGNOSIS — N50819 Testicular pain, unspecified: Secondary | ICD-10-CM | POA: Insufficient documentation

## 2019-12-19 ENCOUNTER — Encounter: Payer: Self-pay | Admitting: Urology

## 2019-12-19 ENCOUNTER — Ambulatory Visit (INDEPENDENT_AMBULATORY_CARE_PROVIDER_SITE_OTHER): Payer: 59 | Admitting: Urology

## 2019-12-19 ENCOUNTER — Other Ambulatory Visit: Payer: Self-pay

## 2019-12-19 VITALS — BP 103/63 | HR 93 | Temp 98.2°F | Ht 70.0 in | Wt 114.0 lb

## 2019-12-19 DIAGNOSIS — N50819 Testicular pain, unspecified: Secondary | ICD-10-CM

## 2019-12-19 LAB — URINALYSIS, ROUTINE W REFLEX MICROSCOPIC
Bilirubin, UA: NEGATIVE
Glucose, UA: NEGATIVE
Ketones, UA: NEGATIVE
Leukocytes,UA: NEGATIVE
Nitrite, UA: NEGATIVE
Protein,UA: NEGATIVE
RBC, UA: NEGATIVE
Specific Gravity, UA: <1.005 — ABNORMAL LOW (ref 1.005–1.030)
Urobilinogen, Ur: 0.2 mg/dL (ref 0.2–1.0)
pH, UA: 6 (ref 5.0–7.5)

## 2019-12-19 MED ORDER — CYCLOBENZAPRINE HCL 5 MG PO TABS: 5.0000 mg | ORAL_TABLET | Freq: Three times a day (TID) | ORAL | 0 refills | Status: AC | PRN

## 2019-12-19 NOTE — Progress Notes (Signed)
Urological Symptom Review  Patient is experiencing the following symptoms: Stream starts and stops Weak stream   Review of Systems  Gastrointestinal (upper)  : Negative for upper GI symptoms  Gastrointestinal (lower) : Constipation  Constitutional : Negative for symptoms  Skin: Negative for skin symptoms  Eyes: Double vision  Ear/Nose/Throat : Negative for Ear/Nose/Throat symptoms  Hematologic/Lymphatic: negative  Cardiovascular : Negative for cardiovascular symptoms  Respiratory : Negative for respiratory symptoms  Endocrine: Negative for endocrine symptoms  Musculoskeletal: Back pain  Neurological: Negative for neurological symptoms  Psychologic: Anxiety

## 2019-12-19 NOTE — Patient Instructions (Signed)
Orchitis  Orchitis is inflammation of a testicle. Testicles are the male organs that produce sperm. The testicles are held in a fleshy sac (scrotum) located behind the penis. Orchitis usually affects only one testicle, but it can affect both. Orchitis is caused by infection. Many kinds of bacteria and viruses can cause this infection. The condition can develop suddenly. What are the causes? This condition may be caused by:  Infection from viruses or bacteria.  Other organisms, such as fungi or parasites (rare). This is common in men who have a weak body defense system (immune system), such as men with HIV. Bacteria   Bacterial orchitis often occurs along with an infection of the tube that collects and stores sperm (epididymis).  In men who are not sexually active, this infection usually starts as a urinary tract infection and spreads to the testicle.  In sexually active men, sexually transmitted infections (STIs) are the most common cause of bacterial orchitis. These can include: ? Gonorrhea. ? Chlamydia. Viruses  Mumps is the most common cause of viral orchitis, though mumps is now rare in many areas because of vaccination.  Other viruses that can cause orchitis include: ? The chickenpox virus (varicella-zoster virus). ? The virus that causes mononucleosis (Epstein-Barr virus). What increases the risk? The following factors may make you more likely to develop this condition:  For viral orchitis: ? Not having been vaccinated against mumps.  For bacterial orchitis: ? Having had frequent urinary tract infections. ? Engaging in high-risk sexual behaviors, such as having multiple sexual partners or having sex without using a condom. ? Having a sexual partner with an STI. ? Having had urinary tract surgery. ? Using a tube that is passed through the penis to drain urine (Foley catheter). ? Having an enlarged prostate gland. What are the signs or symptoms? The most common symptoms of  orchitis are swelling and pain in the scrotum. Other signs and symptoms may include:  Feeling generally sick (malaise).  Fever and chills.  Painful urination.  Painful ejaculation.  Headache.  Fatigue.  Nausea.  Blood or discharge from the penis.  Swollen lymph nodes in the groin area (inguinal nodes). How is this diagnosed? This condition may be diagnosed based on:  Your symptoms. Your health care provider may suspect orchitis if you have a painful, swollen testicle along with other signs and symptoms of the condition.  A physical exam. You may also have other tests, including:  A blood test to check for signs of infection.  A urine test to check for a urinary tract infection or STI.  Using a swab to collect a fluid sample from the tip of the penis to test for STIs.  Taking an image of the testicle using sound waves and a computer (testicular ultrasound). How is this treated? Treatment for this condition depends on the cause.  For bacterial orchitis, your health care provider may prescribe antibiotic medicines. Bacterial infections usually clear up within a few days. For both viral infections and bacterial infections, you may be treated with:  Rest.  Anti-inflammatory medicines.  Pain medicines.  Raising (elevating) the scrotum with a towel or pillow underneath and applying ice. Follow these instructions at home:  Rest as directed by your health care provider.  Take over-the-counter and prescription medicines only as told by your health care provider.  If you were prescribed an antibiotic medicine, take it as told by your health care provider. Do not stop taking the antibiotic even if you start to feel better.    Do not have sex until your health care provider says it is okay to do so.  Elevate your scrotum and apply ice as directed: ? Put ice in a plastic bag. ? Place a small towel or pillow between your legs. ? Rest your scrotum on the pillow or  towel. ? Place another towel between your skin and the plastic bag. ? Leave the ice on for 20 minutes, 2-3 times a day.  Keep all follow-up visits as told by your health care provider. This is important. Contact a health care provider if:  You have a fever.  Pain and swelling have not gotten better after 3 days. Get help right away if:  Your pain is getting worse.  The swelling in your testicle gets worse. Summary  Orchitis is inflammation of a testicle. It is caused by an infection from bacteria or a virus.  The most common symptoms of orchitis are swelling and pain in the scrotum.  Treatment for this condition depends on the cause. It may include medicines to fight the infection, reduce inflammation, and relieve the pain.  Follow your health care provider's instructions about resting, icing, not having sex, and taking medicines. This information is not intended to replace advice given to you by your health care provider. Make sure you discuss any questions you have with your health care provider. Document Revised: 02/27/2017 Document Reviewed: 02/27/2017 Elsevier Patient Education  2020 [employer]  

## 2019-12-19 NOTE — Addendum Note (Signed)
Addended by: Ferdinand Lango on: 12/19/2019 11:26 AM   Modules accepted: Orders

## 2019-12-19 NOTE — Progress Notes (Signed)
12/19/2019 10:52 AM   [place] Jan 29, 1986 [id number]  Referring provider: No referring provider defined for this encounter.  Followup testicular pain  HPI: [name] is a 34yo here for followup for testicular pain. He underwent CT stone study which showed no calculi. He was given flexeril last visit but he did not take it due to size of the tablet. He continues to have dull intermittent mild to moderate left testicular pain. Pain is worse after taking adderall.    PMH: Past Medical History:  Diagnosis Date  . Acid reflux    occ  . ADD (attention deficit disorder with hyperactivity)   . Alcoholism (HCC)    quit 2012, no hx of liver problems per patient  . Anal fissure   . Anxiety   . Depression   . External hemorrhoid, thrombosed   . Rectal bleeding   . Substance abuse [facility])     Surgical History: Past Surgical History:  Procedure Laterality Date  . colonscopy    . HEMORRHOID SURGERY     banding x 2 2016 and hemorrhoid surgery 2012  . HEMORRHOID SURGERY N/A 01/27/2018   Procedure: HEMORHOIDOPEXY;  Surgeon: Romie Levee, MD;  Location: [facility];  Service: General;  Laterality: N/A;  . RECTAL EXAM UNDER ANESTHESIA N/A 01/27/2018   Procedure: ANAL EXAM UNDER ANESTHESIA;  Surgeon: Romie Levee, MD;  Location: [facility];  Service: General;  Laterality: N/A;  . SPHINCTEROTOMY N/A 01/27/2018   Procedure: CHEMICAL SPHINCTEROTOMY (BOTOX);  Surgeon: Romie Levee, MD;  Location: [facility];  Service: General;  Laterality: N/A;  . WISDOM TOOTH EXTRACTION      Home Medications:  Allergies as of 12/19/2019      Reactions   Doxycycline Diarrhea   Immediately had diarrhea after taking the pill (chewed with food) Also felt like stomach was on fire      Medication List       Accurate as of December 19, 2019 10:52 AM. If you have any questions, ask your nurse or doctor.        acetaminophen 500 MG tablet Commonly  known as: TYLENOL Take 1,000 mg by mouth every 6 (six) hours as needed. Will take 1 or 2   ALPRAZolam 0.5 MG tablet Commonly known as: XANAX Take 1 mg by mouth as needed.   ALPRAZolam 1 MG tablet Commonly known as: XANAX Take 1 mg by mouth 3 (three) times daily as needed.   AMBULATORY NON FORMULARY MEDICATION Medication Name: Nitroglycerine ointment 0.125 %  Apply a pea sized amount internally two times daily for 8 to 10 weeks. Dispense 30 GM zero refill   amphetamine-dextroamphetamine 20 MG 24 hr capsule Commonly known as: ADDERALL XR Take 20 mg by mouth every morning.   amphetamine-dextroamphetamine 20 MG tablet Commonly known as: ADDERALL Take 20 mg by mouth 3 (three) times daily.   chlorhexidine 0.12 % solution Commonly known as: PERIDEX SMARTSIG:By Mouth   cyclobenzaprine 5 MG tablet Commonly known as: FLEXERIL Take 1 tablet (5 mg total) by mouth 3 (three) times daily as needed for muscle spasms.   Fiber Select Gummies Chew Chew by mouth. 2 tabs per day 2.5 mg each   GOODY HEADACHE PO Take 1 Dose by mouth as needed.   IBUPROFEN 100 JUNIOR STRENGTH PO Take by mouth. Equate ibuprofen as needed every 6 hours   sertraline 25 MG tablet Commonly known as: ZOLOFT Take 25 mg by mouth daily.  Allergies:  Allergies  Allergen Reactions  . Doxycycline Diarrhea    Immediately had diarrhea after taking the pill (chewed with food) Also felt like stomach was on fire    Family History: Family History  Problem Relation Age of Onset  . Alcohol abuse Mother   . Depression Mother   . Hypertension Father   . Clotting disorder Maternal Grandmother   . Colon cancer Neg Hx   . Esophageal cancer Neg Hx   . Stomach cancer Neg Hx     Social History:  reports that he has been smoking cigarettes. He has a 16.00 pack-year smoking history. He has never used smokeless tobacco. He reports current drug use. Frequency: 14.00 times per week. Drug: Marijuana. He reports that he  does not drink alcohol.  ROS: All other review of systems were reviewed and are negative except what is noted above in HPI  Physical Exam: BP 103/63   Pulse 93   Temp 98.2 F (36.8 C)   Ht 5\' 10"  (1.778 m)   Wt 114 lb (51.7 kg)   BMI 16.36 kg/m   Constitutional:  Alert and oriented, No acute distress. HEENT: [place] AT, moist mucus membranes.  Trachea midline, no masses. Cardiovascular: No clubbing, cyanosis, or edema. Respiratory: Normal respiratory effort, no increased work of breathing. GI: Abdomen is soft, nontender, nondistended, no abdominal masses GU: No CVA tenderness.  Lymph: No cervical or inguinal lymphadenopathy. Skin: No rashes, bruises or suspicious lesions. Neurologic: Grossly intact, no focal deficits, moving all 4 extremities. Psychiatric: Normal mood and affect.  Laboratory Data: Lab Results  Component Value Date   WBC 8.1 11/11/2010   HGB 15.5 11/11/2010   HCT 45.8 11/11/2010   MCV 95.1 11/11/2010   PLT 199.0 11/11/2010    Lab Results  Component Value Date   CREATININE 0.8 11/11/2010    No results found for: PSA  No results found for: TESTOSTERONE  No results found for: HGBA1C  Urinalysis    Component Value Date/Time   COLORURINE YELLOW 05/04/2009 2057   APPEARANCEUR Clear 11/30/2019 1016   LABSPEC 1.010 05/04/2009 2057   PHURINE 5.5 05/04/2009 2057   GLUCOSEU Negative 11/30/2019 1016   HGBUR TRACE (A) 05/04/2009 2057   BILIRUBINUR Negative 11/30/2019 1016   KETONESUR NEGATIVE 05/04/2009 2057   PROTEINUR Negative 11/30/2019 1016   PROTEINUR NEGATIVE 05/04/2009 2057   UROBILINOGEN 0.2 05/04/2009 2057   NITRITE Negative 11/30/2019 1016   NITRITE NEGATIVE 05/04/2009 2057   LEUKOCYTESUR Negative 11/30/2019 1016    Lab Results  Component Value Date   LABMICR See below: 11/30/2019   WBCUA None seen 11/30/2019   LABEPIT None seen 11/30/2019   MUCUS Present 11/30/2019   BACTERIA None seen 11/30/2019    Pertinent Imaging: Ct stone study  12/13/2019: Images reviewed and discussed with the patient No results found for this or any previous visit.  No results found for this or any previous visit.  No results found for this or any previous visit.  No results found for this or any previous visit.  No results found for this or any previous visit.  No results found for this or any previous visit.  No results found for this or any previous visit.  Results for orders placed during the hospital encounter of 12/13/19  CT RENAL STONE STUDY  Narrative CLINICAL DATA:  Left lower quadrant and left-sided testicular pain for 6 weeks.  EXAM: CT ABDOMEN AND PELVIS WITHOUT CONTRAST  TECHNIQUE: Multidetector CT imaging of the abdomen and pelvis  was performed following the standard protocol without IV contrast.  COMPARISON:  None.  FINDINGS: Lower chest: The lung bases are clear of acute process. No pleural effusion or pulmonary lesions. The heart is normal in size. No pericardial effusion. The distal esophagus and aorta are unremarkable.  Hepatobiliary: No hepatic lesions are identified without contrast. The gallbladder appears normal. No intra or extrahepatic biliary dilatation.  Pancreas: No mass, inflammation or ductal dilatation.  Spleen: Normal size.  No focal lesions.  Adrenals/Urinary Tract: Adrenal glands and kidneys are unremarkable. No renal, ureteral or bladder calculi. Simple appearing right renal cyst. No worrisome renal lesions are identified without contrast. The bladder is mildly distended but no bladder mass or asymmetric bladder wall thickening.  Stomach/Bowel: The stomach, duodenum, small bowel and colon are grossly normal without oral contrast. No inflammatory changes, mass lesions or obstructive findings. The appendix is normal.  Vascular/Lymphatic: The aorta is normal in caliber. No atheroscerlotic calcifications. No mesenteric of retroperitoneal mass or adenopathy. Small scattered lymph  nodes are noted.  Reproductive: The prostate gland and seminal vesicles are unremarkable.  Other: No pelvic mass or adenopathy. No free pelvic fluid collections. No inguinal mass or adenopathy or hernia. No abdominal wall hernia or subcutaneous lesions.  Musculoskeletal: No significant bony findings.  IMPRESSION: 1. No acute abdominal/pelvic findings, mass lesions or adenopathy. 2. No renal, ureteral or bladder calculi or mass. 3. Simple appearing right renal cyst.   Electronically Signed By: Rudie Meyer M.D. On: 12/13/2019 14:20   Assessment & Plan:    1. Left testicular pain -Patient instructed to trial flexeril prn   No follow-ups on file.  Wilkie Aye, MD  [facility] [place]

## 2019-12-23 NOTE — Progress Notes (Signed)
Results mailed 

## 2020-03-20 ENCOUNTER — Ambulatory Visit: Payer: 59 | Admitting: Urology

## 2021-04-03 IMAGING — CT CT RENAL STONE PROTOCOL
2 of 3 series · 16 of 46 positions shown, 18 images · non-contrast
Comparison: None.

CLINICAL DATA: Left lower quadrant and left-sided testicular pain
for 6 weeks.

EXAM:
CT ABDOMEN AND PELVIS WITHOUT CONTRAST
TECHNIQUE: Multidetector CT imaging of the abdomen and pelvis was performed
following the standard protocol without IV contrast.

[Series 4: lung bases · axial · 0.62mm/px · z∈[-467,-359]mm · 13 of 62 slices shown, 15 images]
[im 4/62  soft-tissue]
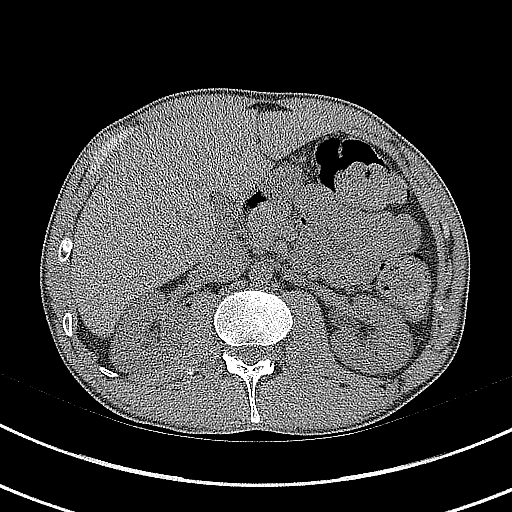
[im 4/62  bone]
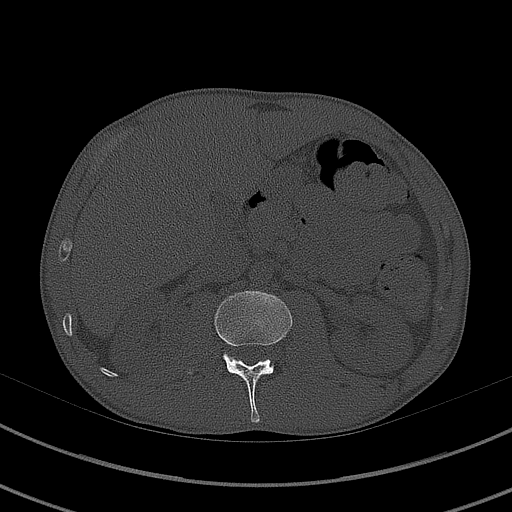
[im 8/62  soft-tissue]
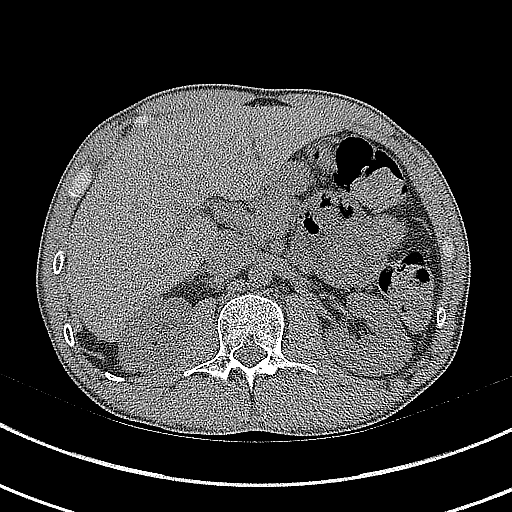
[im 12/62  soft-tissue]
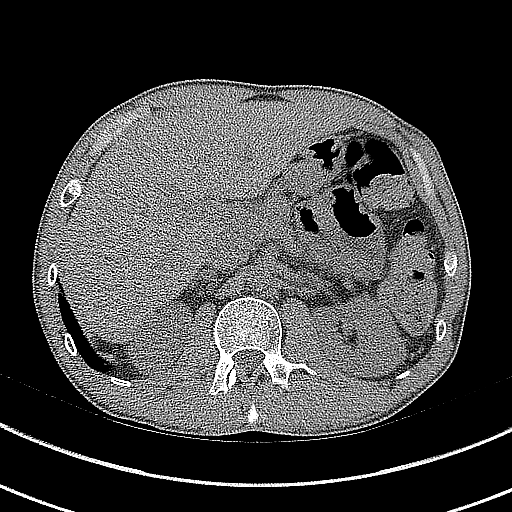
[im 18/62  soft-tissue]
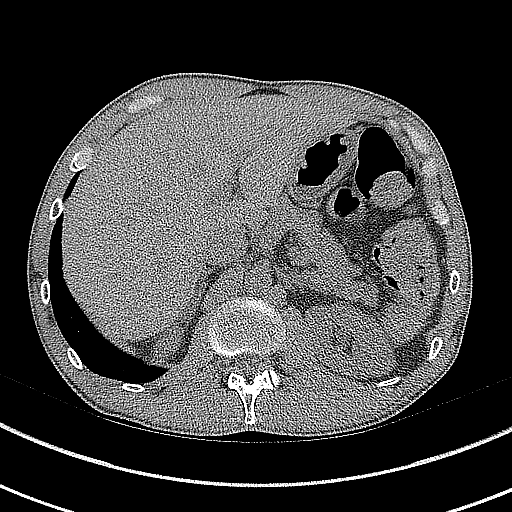
[im 22/62  soft-tissue]
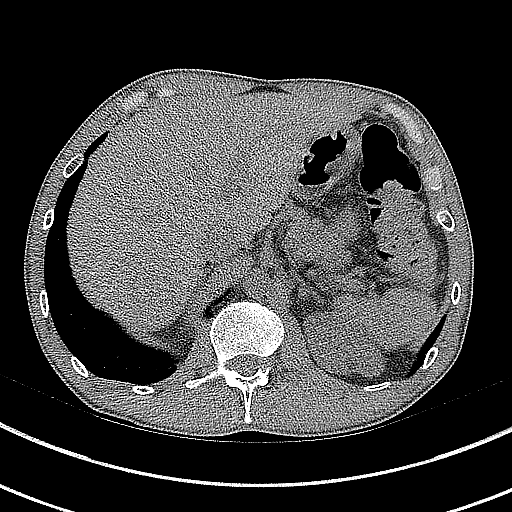
[im 26/62  soft-tissue]
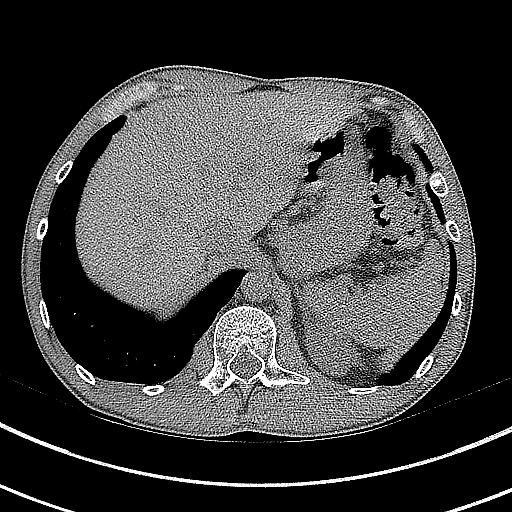
[im 32/62  soft-tissue]
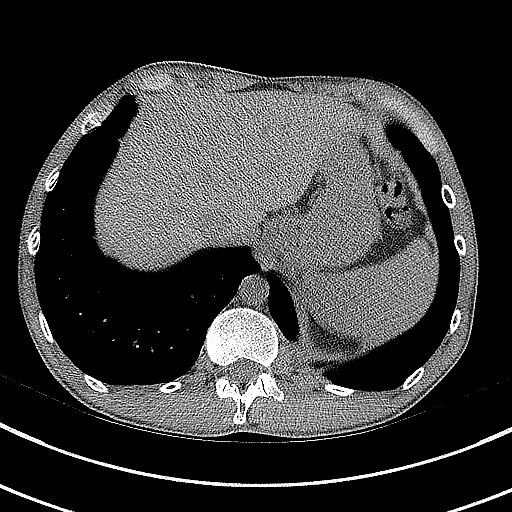
[im 36/62  soft-tissue]
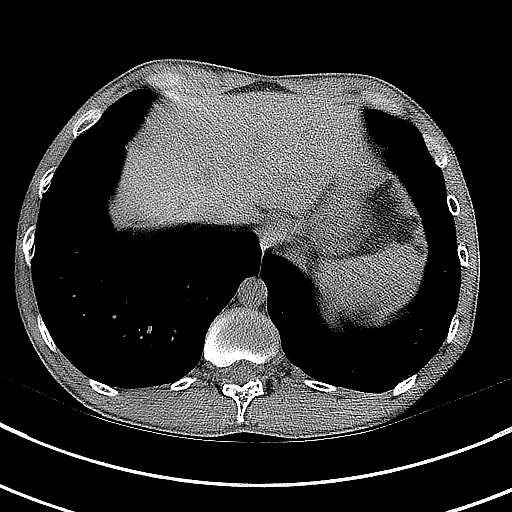
[im 40/62  soft-tissue]
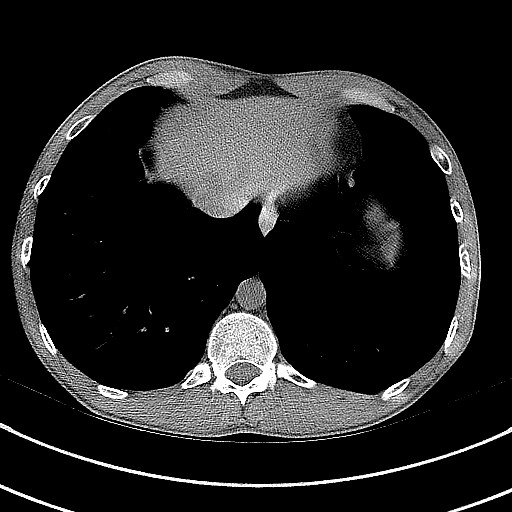
[im 40/62  bone]
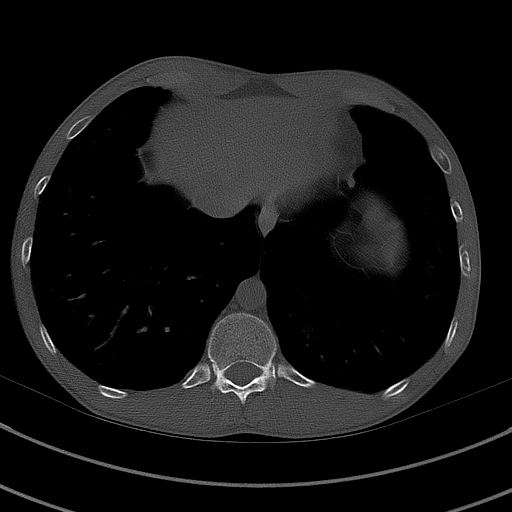
[im 44/62  soft-tissue]
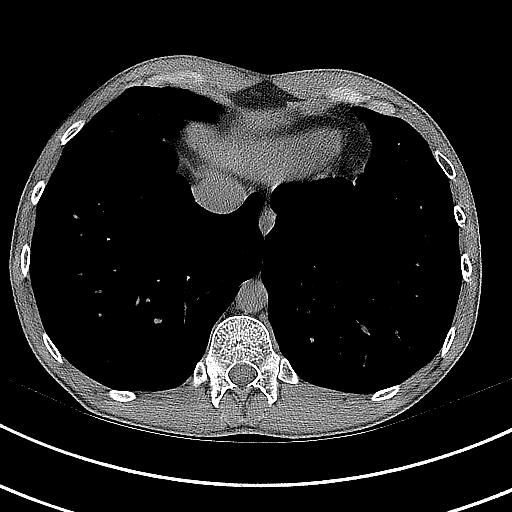
[im 50/62  soft-tissue]
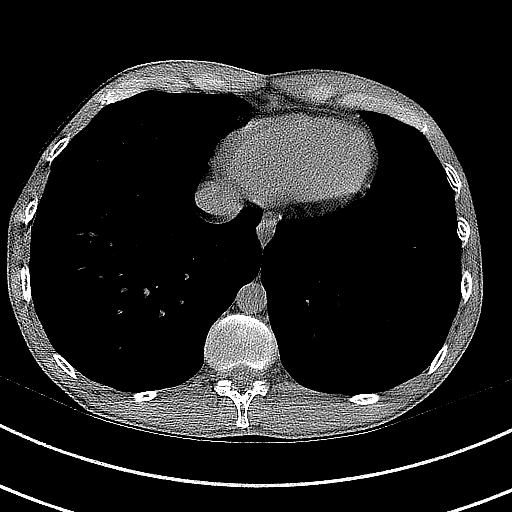
[im 54/62  soft-tissue]
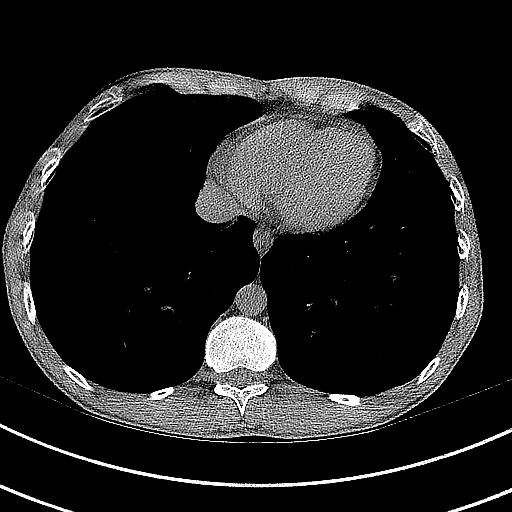
[im 58/62  soft-tissue]
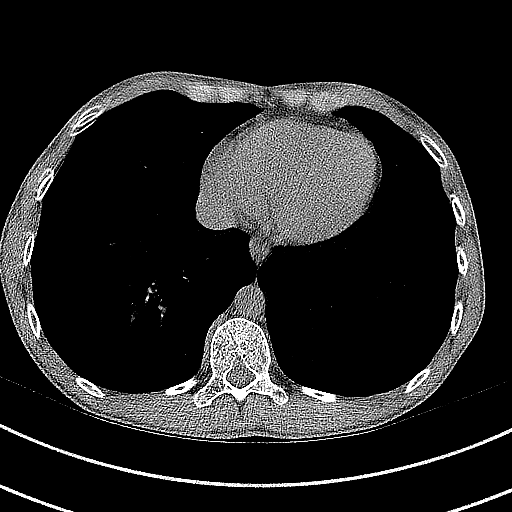

[Series 5: coronal · coronal · 0.66mm/px · 3 of 120 slices shown]
[im 40/120  soft-tissue]
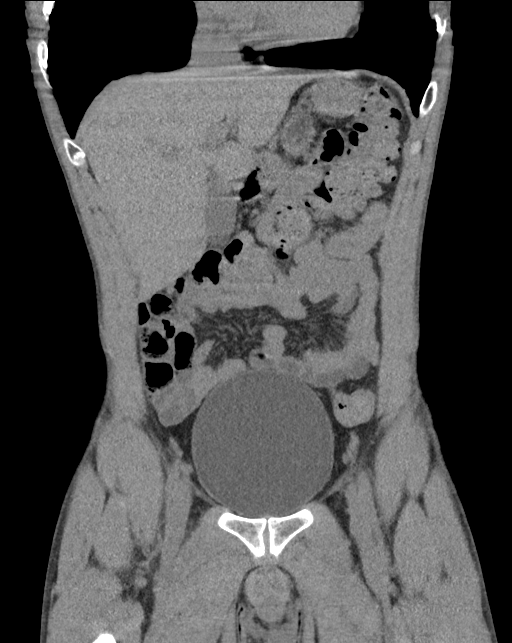
[im 53/120  soft-tissue]
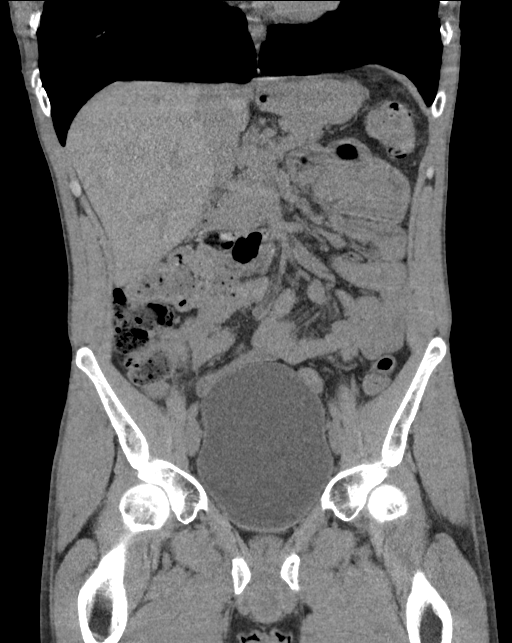
[im 67/120  soft-tissue]
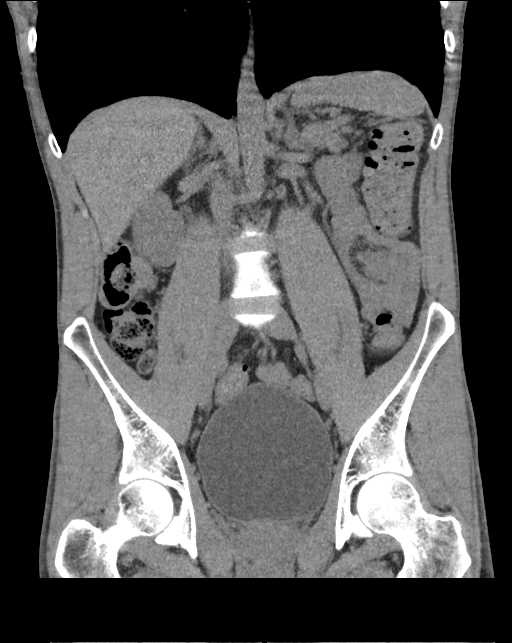

[16 of 46 positions shown; findings below may reference images not displayed]

FINDINGS: Lower chest: The lung bases are clear of acute process. No pleural
effusion or pulmonary lesions. The heart is normal in size. No
pericardial effusion. The distal esophagus and aorta are
unremarkable.

Hepatobiliary: No hepatic lesions are identified without contrast.
The gallbladder appears normal. No intra or extrahepatic biliary
dilatation.

Pancreas: No mass, inflammation or ductal dilatation.

Spleen: Normal size.  No focal lesions.

Adrenals/Urinary Tract: Adrenal glands and kidneys are unremarkable.
No renal, ureteral or bladder calculi. Simple appearing right renal
cyst. No worrisome renal lesions are identified without contrast.
The bladder is mildly distended but no bladder mass or asymmetric
bladder wall thickening.

Stomach/Bowel: The stomach, duodenum, small bowel and colon are
grossly normal without oral contrast. No inflammatory changes, mass
lesions or obstructive findings. The appendix is normal.

Vascular/Lymphatic: The aorta is normal in caliber. No
atheroscerlotic calcifications. No mesenteric of retroperitoneal
mass or adenopathy. Small scattered lymph nodes are noted.

Reproductive: The prostate gland and seminal vesicles are
unremarkable.

Other: No pelvic mass or adenopathy. No free pelvic fluid
collections. No inguinal mass or adenopathy or hernia. No abdominal
wall hernia or subcutaneous lesions.

Musculoskeletal: No significant bony findings.
IMPRESSION: 1. No acute abdominal/pelvic findings, mass lesions or adenopathy.
2. No renal, ureteral or bladder calculi or mass.
3. Simple appearing right renal cyst.
